# Patient Record
Sex: Female | Born: 1935 | Race: White | Hispanic: No | Marital: Married | Smoking: Never smoker
Health system: Southern US, Community
[De-identification: ages and names within clinical notes are randomized; demographics above are authoritative.]

## PROBLEM LIST (undated history)

## (undated) DIAGNOSIS — I10 Essential (primary) hypertension: Secondary | ICD-10-CM

## (undated) DIAGNOSIS — I4891 Unspecified atrial fibrillation: Secondary | ICD-10-CM

## (undated) HISTORY — DX: Unspecified atrial fibrillation: I48.91

## (undated) HISTORY — DX: Essential (primary) hypertension: I10

## (undated) HISTORY — PX: APPENDECTOMY: SHX54

---

## 1962-11-30 HISTORY — PX: ABDOMINAL HYSTERECTOMY: SHX81

## 2004-09-16 ENCOUNTER — Ambulatory Visit: Payer: Self-pay | Admitting: Surgery

## 2006-01-07 ENCOUNTER — Ambulatory Visit: Payer: Self-pay | Admitting: Internal Medicine

## 2007-05-05 ENCOUNTER — Ambulatory Visit: Payer: Self-pay | Admitting: Internal Medicine

## 2008-04-17 ENCOUNTER — Other Ambulatory Visit: Payer: Self-pay

## 2008-04-17 ENCOUNTER — Ambulatory Visit: Payer: Self-pay | Admitting: Ophthalmology

## 2008-04-30 ENCOUNTER — Ambulatory Visit: Payer: Self-pay | Admitting: Ophthalmology

## 2008-06-04 ENCOUNTER — Ambulatory Visit: Payer: Self-pay | Admitting: Ophthalmology

## 2008-08-23 ENCOUNTER — Ambulatory Visit: Payer: Self-pay | Admitting: Internal Medicine

## 2008-08-29 ENCOUNTER — Ambulatory Visit: Payer: Self-pay | Admitting: Internal Medicine

## 2009-12-30 ENCOUNTER — Ambulatory Visit: Payer: Self-pay | Admitting: Cardiology

## 2010-07-10 ENCOUNTER — Ambulatory Visit: Payer: Self-pay | Admitting: Internal Medicine

## 2011-07-29 ENCOUNTER — Ambulatory Visit: Payer: Self-pay | Admitting: Internal Medicine

## 2011-09-09 ENCOUNTER — Ambulatory Visit: Payer: Self-pay | Admitting: Internal Medicine

## 2012-04-19 ENCOUNTER — Ambulatory Visit: Payer: Self-pay | Admitting: Internal Medicine

## 2013-09-27 ENCOUNTER — Encounter: Payer: Self-pay | Admitting: *Deleted

## 2013-10-10 ENCOUNTER — Other Ambulatory Visit: Payer: Self-pay | Admitting: General Surgery

## 2013-10-10 ENCOUNTER — Encounter: Payer: Self-pay | Admitting: General Surgery

## 2013-10-10 ENCOUNTER — Ambulatory Visit (INDEPENDENT_AMBULATORY_CARE_PROVIDER_SITE_OTHER): Payer: Medicare Other | Admitting: General Surgery

## 2013-10-10 VITALS — BP 124/70 | HR 72 | Resp 12 | Ht 68.0 in | Wt 151.0 lb

## 2013-10-10 DIAGNOSIS — K4090 Unilateral inguinal hernia, without obstruction or gangrene, not specified as recurrent: Secondary | ICD-10-CM

## 2013-10-10 NOTE — Progress Notes (Addendum)
Patient ID: Lydia [name], female   DOB: 22-Sep-1928, 79 y.o.   MRN: [id number]  Chief Complaint  Patient presents with  . Other    new patient evaluation of right inguinal hernia     HPI IVRY PIGUE is a 79 y.o. female.  Patient here today for an evaluation of a right  hernia.  States that they have noticed it for about 3  Months. She states it is not getting bigger .  It does seem to be causing some abdominal pain.  No nausea, vomiting, constipation or diarrhea noted. There has been no change in her bowel habits. She is found that the hernia is most likely to be symptomatic when she's been active. She has been able to obtain relief and experienced spontaneous reduction if she puts her feet up for just a few minutes. She was accompanied today by her daughter, Gaylene Brooks, who was present for the interview and exam.  The patient reports no history of congestive heart failure, myocardial infarction or stroke. She's been making use of Coumadin for the last 3 years for atrial fibrillation. HPI  Past Medical History  Diagnosis Date  . Hypertension   . A-fib     Past Surgical History  Procedure Laterality Date  . Appendectomy    . Abdominal hysterectomy  1964    Family History  Problem Relation Age of Onset  . Cervical cancer Mother   . Lung cancer Brother     Social History History  Substance Use Topics  . Smoking status: Never Smoker   . Smokeless tobacco: Never Used  . Alcohol Use: No    No Known Allergies  Current Outpatient Prescriptions  Medication Sig Dispense Refill  . furosemide (LASIX) 20 MG tablet Take 1 tablet by mouth daily.      Marland Kitchen lisinopril (PRINIVIL,ZESTRIL) 5 MG tablet Take 0.5 tablets by mouth daily.      . metoprolol (LOPRESSOR) 100 MG tablet Take 1 tablet by mouth 2 (two) times daily.      . potassium chloride SA (K-DUR,KLOR-CON) 20 MEQ tablet Take 1 tablet by mouth daily.      Marland Kitchen warfarin (COUMADIN) 2.5 MG tablet Take 1 tablet by mouth daily.        No current facility-administered medications for this visit.    Review of Systems Review of Systems  Constitutional: Negative.   Respiratory: Negative.   Cardiovascular: Negative.     Blood pressure 124/70, pulse 72, resp. rate 12, height 5\' 8"  (1.727 m), weight 151 lb (68.493 kg).  Physical Exam Physical Exam  Constitutional: She is oriented to person, place, and time. She appears well-developed and well-nourished.  Eyes: No scleral icterus.  Cardiovascular: An irregular rhythm present.  Pulmonary/Chest: Effort normal and breath sounds normal.  Abdominal: Soft. A hernia is present. Hernia confirmed positive in the right inguinal area.  Lymphadenopathy:    She has no cervical adenopathy.  Neurological: She is oriented to person, place, and time.  Skin: Skin is warm and dry.    Data Reviewed PCP notes on September 27, 2013 were reviewed. Previous echocardiogram showed an ejection fraction of 55%. Mild biatrial enlargement. Mild to moderate mitral insufficiency and moderate to severe tricuspid insufficiency. Mild aortic insufficiency. Clydie Braun, M.D. Supervising physician.  Assessment    Symptomatic right inguinal hernia.     Plan    Indication for elective surgery was reviewed. The patient still fairly active, and as she is becoming more symptomatic with less physical activity earlier  v repair was recommended. The roll for prosthetic mesh was discussed.  The patient has had no high-risk events making bridging therapy necessary. The patient has been instructed to discontinue her Coumadin 5 days prior to the procedure, and begin an 81 mg aspirin one week prior to the procedure.     Patient's surgery has been scheduled for 10-19-13 at [facility].    Earline Mayotte 10/10/2013, 8:41 PM

## 2013-10-10 NOTE — Patient Instructions (Addendum)
Inguinal Hernia, Adult Muscles help keep everything in the body in its proper place. But if a weak spot in the muscles develops, something can poke through. That is called a hernia. When this happens in the lower part of the belly (abdomen), it is called an inguinal hernia. (It takes its name from a part of the body in this region called the inguinal canal.) A weak spot in the wall of muscles lets some fat or part of the small intestine bulge through. An inguinal hernia can develop at any age. Men get them more often than women. CAUSES  In adults, an inguinal hernia develops over time.  It can be triggered by:  Suddenly straining the muscles of the lower abdomen.  Lifting heavy objects.  Straining to have a bowel movement. Difficult bowel movements (constipation) can lead to this.  Constant coughing. This may be caused by smoking or lung disease.  Being overweight.  Being pregnant.  Working at a job that requires long periods of standing or heavy lifting.  Having had an inguinal hernia before. One type can be an emergency situation. It is called a strangulated inguinal hernia. It develops if part of the small intestine slips through the weak spot and cannot get back into the abdomen. The blood supply can be cut off. If that happens, part of the intestine may die. This situation requires emergency surgery. SYMPTOMS  Often, a small inguinal hernia has no symptoms. It is found when a healthcare provider does a physical exam. Larger hernias usually have symptoms.   In adults, symptoms may include:  A lump in the groin. This is easier to see when the person is standing. It might disappear when lying down.  In men, a lump in the scrotum.  Pain or burning in the groin. This occurs especially when lifting, straining or coughing.  A dull ache or feeling of pressure in the groin.  Signs of a strangulated hernia can include:  A bulge in the groin that becomes very painful and tender to the  touch.  A bulge that turns red or purple.  Fever, nausea and vomiting.  Inability to have a bowel movement or to pass gas. DIAGNOSIS  To decide if you have an inguinal hernia, a healthcare provider will probably do a physical examination.  This will include asking questions about any symptoms you have noticed.  The healthcare provider might feel the groin area and ask you to cough. If an inguinal hernia is felt, the healthcare provider may try to slide it back into the abdomen.  Usually no other tests are needed. TREATMENT  Treatments can vary. The size of the hernia makes a difference. Options include:  Watchful waiting. This is often suggested if the hernia is small and you have had no symptoms.  No medical procedure will be done unless symptoms develop.  You will need to watch closely for symptoms. If any occur, contact your healthcare provider right away.  Surgery. This is used if the hernia is larger or you have symptoms.  Open surgery. This is usually an outpatient procedure (you will not stay overnight in a hospital). An cut (incision) is made through the skin in the groin. The hernia is put back inside the abdomen. The weak area in the muscles is then repaired by herniorrhaphy or hernioplasty. Herniorrhaphy: in this type of surgery, the weak muscles are sewn back together. Hernioplasty: a patch or mesh is used to close the weak area in the abdominal wall.  Laparoscopy.   In this procedure, a surgeon makes small incisions. A thin tube with a tiny video camera (called a laparoscope) is put into the abdomen. The surgeon repairs the hernia with mesh by looking with the video camera and using two long instruments. HOME CARE INSTRUCTIONS   After surgery to repair an inguinal hernia:  You will need to take pain medicine prescribed by your healthcare provider. Follow all directions carefully.  You will need to take care of the wound from the incision.  Your activity will be  restricted for awhile. This will probably include no heavy lifting for several weeks. You also should not do anything too active for a few weeks. When you can return to work will depend on the type of job that you have.  During "watchful waiting" periods, you should:  Maintain a healthy weight.  Eat a diet high in fiber (fruits, vegetables and whole grains).  Drink plenty of fluids to avoid constipation. This means drinking enough water and other liquids to keep your urine clear or pale yellow.  Do not lift heavy objects.  Do not stand for long periods of time.  Quit smoking. This should keep you from developing a frequent cough. SEEK MEDICAL CARE IF:   A bulge develops in your groin area.  You feel pain, a burning sensation or pressure in the groin. This might be worse if you are lifting or straining.  You develop a fever of more than 100.5 F (38.1 C). SEEK IMMEDIATE MEDICAL CARE IF:   Pain in the groin increases suddenly.  A bulge in the groin gets bigger suddenly and does not go down.  For men, there is sudden pain in the scrotum. Or, the size of the scrotum increases.  A bulge in the groin area becomes red or purple and is painful to touch.  You have nausea or vomiting that does not go away.  You feel your heart beating much faster than normal.  You cannot have a bowel movement or pass gas.  You develop a fever of more than 102.0 F (38.9 C). Document Released: 04/04/2009 Document Revised: 02/08/2012 Document Reviewed: 04/04/2009 [facility] Patient Information 2014 [place], [place].  Patient's surgery has been scheduled for 10-19-13 at [facility]. This patient has been asked to stop coumadin 7 days prior to procedure. Once coumadin has stopped she will need to begin an 81 mg aspirin.

## 2013-10-12 ENCOUNTER — Ambulatory Visit: Payer: Self-pay | Admitting: General Surgery

## 2013-10-12 ENCOUNTER — Telehealth: Payer: Self-pay | Admitting: *Deleted

## 2013-10-12 LAB — CBC WITH DIFFERENTIAL/PLATELET
Basophil #: 0 10*3/uL (ref 0.0–0.1)
Basophil %: 0.7 %
Eosinophil #: 0.1 10*3/uL (ref 0.0–0.7)
Eosinophil %: 2 %
Lymphocyte #: 1.6 10*3/uL (ref 1.0–3.6)
MCH: 30.1 pg (ref 26.0–34.0)
Monocyte #: 0.5 x10 3/mm (ref 0.2–0.9)
Monocyte %: 9.3 %
Neutrophil #: 3.3 10*3/uL (ref 1.4–6.5)
WBC: 5.6 10*3/uL (ref 3.6–11.0)

## 2013-10-12 LAB — BASIC METABOLIC PANEL
Anion Gap: 2 — ABNORMAL LOW (ref 7–16)
BUN: 25 mg/dL — ABNORMAL HIGH (ref 7–18)
Chloride: 103 mmol/L (ref 98–107)
Creatinine: 0.87 mg/dL (ref 0.60–1.30)
EGFR (Non-African Amer.): >60
Osmolality: 277 (ref 275–301)

## 2013-10-12 NOTE — Telephone Encounter (Signed)
Daughter notified as instructed. She will make sure mom is aware of how to take her medications. Patient to pre-admit this afternoon at [facility].

## 2013-10-12 NOTE — Telephone Encounter (Signed)
Message copied by Nicholes Mango on Thu Oct 12, 2013  9:42 AM ------      Message from: [place], Merrily Pew      Created: Tue Oct 10, 2013  8:47 PM       I have instructed the patient to discontinue her Coumadin 5 days prior to surgery. On your notation on he scheduling sheet you recorded the patient was to discontinue Coumadin therapy 7 days prior to surgery. 5 days as the correct interval. Please notify the patient/daughter. ------

## 2013-10-19 ENCOUNTER — Ambulatory Visit: Payer: Self-pay | Admitting: General Surgery

## 2013-10-19 DIAGNOSIS — K4090 Unilateral inguinal hernia, without obstruction or gangrene, not specified as recurrent: Secondary | ICD-10-CM

## 2013-10-19 HISTORY — PX: HERNIA REPAIR: SHX51

## 2013-10-19 LAB — PROTIME-INR
INR: 1.1
Prothrombin Time: 14.2 secs (ref 11.5–14.7)

## 2013-10-23 ENCOUNTER — Encounter: Payer: Self-pay | Admitting: General Surgery

## 2013-11-01 ENCOUNTER — Encounter: Payer: Self-pay | Admitting: General Surgery

## 2013-11-01 ENCOUNTER — Ambulatory Visit (INDEPENDENT_AMBULATORY_CARE_PROVIDER_SITE_OTHER): Payer: Medicare Other | Admitting: General Surgery

## 2013-11-01 VITALS — BP 124/70 | HR 70 | Resp 12 | Ht 68.0 in | Wt 157.0 lb

## 2013-11-01 DIAGNOSIS — K4090 Unilateral inguinal hernia, without obstruction or gangrene, not specified as recurrent: Secondary | ICD-10-CM

## 2013-11-01 NOTE — Progress Notes (Signed)
Patient ID: Lydia [name], female   DOB: 1928/08/26, 79 y.o.   MRN: [id number]  Chief Complaint  Patient presents with  . Routine Post Op    right inguinal hernia    HPI Lydia [name] is a 79 y.o. female here today for her post op right inguinal hernia done on 10/19/13. Patient states the area is showing some discoloration and remained sore. She also noted some swelling superior to the wound and the last one-2 days.  The patient denies any difficulty with bowel or bladder function. Ambulating well. HPI  Past Medical History  Diagnosis Date  . Hypertension   . A-fib     Past Surgical History  Procedure Laterality Date  . Appendectomy    . Abdominal hysterectomy  1964    Family History  Problem Relation Age of Onset  . Cervical cancer Mother   . Lung cancer Brother     Social History History  Substance Use Topics  . Smoking status: Never Smoker   . Smokeless tobacco: Never Used  . Alcohol Use: No    No Known Allergies  Current Outpatient Prescriptions  Medication Sig Dispense Refill  . furosemide (LASIX) 20 MG tablet Take 1 tablet by mouth daily.      Marland Kitchen lisinopril (PRINIVIL,ZESTRIL) 5 MG tablet Take 0.5 tablets by mouth daily.      . metoprolol (LOPRESSOR) 100 MG tablet Take 1 tablet by mouth 2 (two) times daily.      . potassium chloride SA (K-DUR,KLOR-CON) 20 MEQ tablet Take 1 tablet by mouth daily.      Marland Kitchen warfarin (COUMADIN) 2.5 MG tablet Take 1 tablet by mouth daily.       No current facility-administered medications for this visit.    Review of Systems Review of Systems  Constitutional: Negative.   Respiratory: Negative.   Cardiovascular: Negative.     Blood pressure 124/70, pulse 70, resp. rate 12, height 5\' 8"  (1.727 m), weight 157 lb (71.215 kg).  Physical Exam Physical Exam  Constitutional: She is oriented to person, place, and time. She appears well-developed and well-nourished.  Neurological: She is alert and oriented to person, place, and  time.  Skin: Skin is warm and dry.  Examination of the incision chose to be healing well. Mild subcutaneous ecchymosis in light of her ongoing anticoagulation is appreciated. Mild fullness superiorly, questionable stromal versus early hematoma. Nontender. No impulse on cough.  Data Reviewed   Assessment    Doing well status post inguinal hernia repair.     Plan    The patient was encouraged to make use of local lead for comfort.        Earline Mayotte 11/03/2013, 8:01 PM

## 2013-11-01 NOTE — Patient Instructions (Signed)
The patient is aware to use a heating pad two to three times daily.

## 2013-11-09 ENCOUNTER — Ambulatory Visit (INDEPENDENT_AMBULATORY_CARE_PROVIDER_SITE_OTHER): Payer: Medicare Other | Admitting: General Surgery

## 2013-11-09 ENCOUNTER — Telehealth: Payer: Self-pay

## 2013-11-09 ENCOUNTER — Encounter: Payer: Self-pay | Admitting: General Surgery

## 2013-11-09 ENCOUNTER — Other Ambulatory Visit: Payer: Medicare Other

## 2013-11-09 VITALS — BP 110/60 | HR 70 | Resp 16 | Ht 67.0 in | Wt 155.0 lb

## 2013-11-09 DIAGNOSIS — K4090 Unilateral inguinal hernia, without obstruction or gangrene, not specified as recurrent: Secondary | ICD-10-CM

## 2013-11-09 NOTE — Patient Instructions (Signed)
Patient to use an ice pack for a week. Patient to return as scheduled.

## 2013-11-09 NOTE — Progress Notes (Signed)
Patient ID: Lydia [name], female   DOB: Jul 23, 1928, 79 y.o.   MRN: [id number]  Chief Complaint  Patient presents with  . Routine Post Op    right inguinal hernia     HPI Lydia [name] is a 79 y.o. female here today for a follow up from right inguinal hernia repair done on 10/19/13. Patient states the area is swollen and wants it rechecked. HPI  Past Medical History  Diagnosis Date  . Hypertension   . A-fib     Past Surgical History  Procedure Laterality Date  . Appendectomy    . Abdominal hysterectomy  1964  . Hernia repair Right 10/19/13    inguinal    Family History  Problem Relation Age of Onset  . Cervical cancer Mother   . Lung cancer Brother     Social History History  Substance Use Topics  . Smoking status: Never Smoker   . Smokeless tobacco: Never Used  . Alcohol Use: No    No Known Allergies  Current Outpatient Prescriptions  Medication Sig Dispense Refill  . furosemide (LASIX) 20 MG tablet Take 1 tablet by mouth daily.      Marland Kitchen lisinopril (PRINIVIL,ZESTRIL) 5 MG tablet Take 0.5 tablets by mouth daily.      . metoprolol (LOPRESSOR) 100 MG tablet Take 1 tablet by mouth 2 (two) times daily.      . potassium chloride SA (K-DUR,KLOR-CON) 20 MEQ tablet Take 1 tablet by mouth daily.      Marland Kitchen warfarin (COUMADIN) 2.5 MG tablet Take 1 tablet by mouth daily.       No current facility-administered medications for this visit.    Review of Systems Review of Systems  Constitutional: Negative.   Respiratory: Negative.   Cardiovascular: Negative.     Blood pressure 110/60, pulse 70, resp. rate 16, height 5\' 7"  (1.702 m), weight 155 lb (70.308 kg).  Physical Exam Physical Exam  Constitutional: She is oriented to person, place, and time. She appears well-developed and well-nourished.  Eyes: No scleral icterus.  Cardiovascular: Normal rate, regular rhythm and normal heart sounds.   Abdominal: Soft. Bowel sounds are normal. There is no tenderness. No hernia.     Lymphadenopathy:    She has no cervical adenopathy.  Neurological: She is alert and oriented to person, place, and time.  Skin: Skin is warm and dry.    Data Reviewed Ultrasound examination (no images, no charge) showed a heterogeneous hypoechoic area below the level of Scarpa's fascia with multiple linear band suggestive of hematoma. No variation during Valsalva maneuver.  Assessment    Hematoma status post right open inguinal hernia repair.     Plan    No evidence of infection or recurrent herniation. The patient will make use of a trial of iced/heat for comfort. Reassessment was originally scheduled on December 05, 2013 unless new symptoms develop.   I don't see that is necessary for her to discontinue her Coumadin at this time. No indication for hematoma evacuation based on present exam.       Lydia [name] 11/09/2013, 7:31 PM

## 2013-11-09 NOTE — Telephone Encounter (Signed)
Patien'ts daughter called and said her mother's incision site is still red and swollen and very sore. She has been using heat as instructed but the area does not seem to be getting any better. She is very concerned.

## 2013-11-26 ENCOUNTER — Emergency Department: Payer: Self-pay | Admitting: Emergency Medicine

## 2013-11-26 LAB — CBC
MCV: 92 fL (ref 80–100)
Platelet: 166 10*3/uL (ref 150–440)
RBC: 4.73 10*6/uL (ref 3.80–5.20)
WBC: 6.6 10*3/uL (ref 3.6–11.0)

## 2013-11-26 LAB — BASIC METABOLIC PANEL
Anion Gap: 7 (ref 7–16)
BUN: 20 mg/dL — ABNORMAL HIGH (ref 7–18)
Calcium, Total: 9.2 mg/dL (ref 8.5–10.1)
Chloride: 101 mmol/L (ref 98–107)
Co2: 26 mmol/L (ref 21–32)
Creatinine: 0.92 mg/dL (ref 0.60–1.30)
EGFR (African American): >60
EGFR (Non-African Amer.): 58 — ABNORMAL LOW
Glucose: 108 mg/dL — ABNORMAL HIGH (ref 65–99)
Osmolality: 271 (ref 275–301)
Potassium: 4.4 mmol/L (ref 3.5–5.1)
Sodium: 134 mmol/L — ABNORMAL LOW (ref 136–145)

## 2013-11-26 LAB — PROTIME-INR: Prothrombin Time: 28 secs — ABNORMAL HIGH (ref 11.5–14.7)

## 2013-11-26 LAB — APTT: Activated PTT: 43.2 secs — ABNORMAL HIGH (ref 23.6–35.9)

## 2013-11-27 ENCOUNTER — Ambulatory Visit: Payer: Self-pay

## 2013-11-28 ENCOUNTER — Inpatient Hospital Stay: Payer: Self-pay | Admitting: Internal Medicine

## 2013-11-28 LAB — URINALYSIS, COMPLETE
Bilirubin,UR: NEGATIVE
Glucose,UR: NEGATIVE mg/dL (ref 0–75)
Hyaline Cast: 18
Leukocyte Esterase: NEGATIVE
Ph: 5 (ref 4.5–8.0)
Specific Gravity: 1.019 (ref 1.003–1.030)

## 2013-11-28 LAB — BASIC METABOLIC PANEL
Calcium, Total: 8.8 mg/dL (ref 8.5–10.1)
Chloride: 98 mmol/L (ref 98–107)
Co2: 27 mmol/L (ref 21–32)
Creatinine: 0.89 mg/dL (ref 0.60–1.30)
EGFR (African American): >60
Potassium: 4.2 mmol/L (ref 3.5–5.1)
Sodium: 131 mmol/L — ABNORMAL LOW (ref 136–145)

## 2013-11-28 LAB — CBC
HCT: 43 % (ref 35.0–47.0)
HGB: 14.3 g/dL (ref 12.0–16.0)
MCH: 30.3 pg (ref 26.0–34.0)
MCHC: 33.2 g/dL (ref 32.0–36.0)
MCV: 92 fL (ref 80–100)
Platelet: 119 10*3/uL — ABNORMAL LOW (ref 150–440)
RBC: 4.7 10*6/uL (ref 3.80–5.20)

## 2013-11-28 LAB — TROPONIN I: Troponin-I: <0.02 ng/mL

## 2013-11-28 LAB — PROTIME-INR: INR: 2.2

## 2013-11-29 LAB — MAGNESIUM: Magnesium: 1.9 mg/dL

## 2013-11-29 LAB — BASIC METABOLIC PANEL
BUN: 16 mg/dL (ref 7–18)
Chloride: 103 mmol/L (ref 98–107)
Creatinine: 0.76 mg/dL (ref 0.60–1.30)
EGFR (African American): >60
EGFR (Non-African Amer.): >60
Glucose: 119 mg/dL — ABNORMAL HIGH (ref 65–99)
Osmolality: 276 (ref 275–301)

## 2013-11-29 LAB — CBC WITH DIFFERENTIAL/PLATELET
Basophil #: 0 10*3/uL (ref 0.0–0.1)
Basophil %: 0.4 %
Eosinophil #: 0 10*3/uL (ref 0.0–0.7)
HCT: 38.5 % (ref 35.0–47.0)
Lymphocyte %: 10 %
MCH: 30.7 pg (ref 26.0–34.0)
MCHC: 33.3 g/dL (ref 32.0–36.0)
MCV: 92 fL (ref 80–100)
Monocyte #: 0.7 x10 3/mm (ref 0.2–0.9)
Monocyte %: 11.2 %
Neutrophil #: 5.2 10*3/uL (ref 1.4–6.5)
Neutrophil %: 78.3 %
Platelet: 104 10*3/uL — ABNORMAL LOW (ref 150–440)
RDW: 14.4 % (ref 11.5–14.5)

## 2013-11-29 LAB — PROTIME-INR: Prothrombin Time: 20 secs — ABNORMAL HIGH (ref 11.5–14.7)

## 2013-12-01 LAB — URINALYSIS, COMPLETE
BILIRUBIN, UR: NEGATIVE
BLOOD: NEGATIVE
Bacteria: NONE SEEN
GLUCOSE, UR: NEGATIVE mg/dL (ref 0–75)
Ketone: NEGATIVE
Leukocyte Esterase: NEGATIVE
Nitrite: NEGATIVE
PH: 5 (ref 4.5–8.0)
Protein: 30
RBC,UR: 2 /HPF (ref 0–5)
Specific Gravity: 1.018 (ref 1.003–1.030)
Squamous Epithelial: 1
WBC UR: 1 /HPF (ref 0–5)

## 2013-12-01 LAB — BASIC METABOLIC PANEL
ANION GAP: 5 — AB (ref 7–16)
BUN: 22 mg/dL — AB (ref 7–18)
CALCIUM: 8.9 mg/dL (ref 8.5–10.1)
CHLORIDE: 101 mmol/L (ref 98–107)
CO2: 27 mmol/L (ref 21–32)
Creatinine: 0.76 mg/dL (ref 0.60–1.30)
EGFR (African American): >60
Glucose: 149 mg/dL — ABNORMAL HIGH (ref 65–99)
OSMOLALITY: 273 (ref 275–301)
Potassium: 3.9 mmol/L (ref 3.5–5.1)
Sodium: 133 mmol/L — ABNORMAL LOW (ref 136–145)

## 2013-12-01 LAB — CBC
HCT: 42.6 % (ref 35.0–47.0)
HGB: 14.2 g/dL (ref 12.0–16.0)
MCH: 30.5 pg (ref 26.0–34.0)
MCHC: 33.2 g/dL (ref 32.0–36.0)
MCV: 92 fL (ref 80–100)
Platelet: 134 10*3/uL — ABNORMAL LOW (ref 150–440)
RBC: 4.64 10*6/uL (ref 3.80–5.20)
RDW: 14.3 % (ref 11.5–14.5)
WBC: 5.6 10*3/uL (ref 3.6–11.0)

## 2013-12-01 LAB — TROPONIN I: Troponin-I: <0.02 ng/mL

## 2013-12-01 LAB — PRO B NATRIURETIC PEPTIDE: B-TYPE NATIURETIC PEPTID: 8041 pg/mL — AB (ref 0–450)

## 2013-12-02 ENCOUNTER — Observation Stay: Payer: Self-pay

## 2013-12-02 LAB — CBC WITH DIFFERENTIAL/PLATELET
BASOS ABS: 0 10*3/uL (ref 0.0–0.1)
BASOS PCT: 0.4 %
EOS PCT: 0 %
Eosinophil #: 0 10*3/uL (ref 0.0–0.7)
HCT: 41.6 % (ref 35.0–47.0)
HGB: 13.8 g/dL (ref 12.0–16.0)
Lymphocyte #: 0.7 10*3/uL — ABNORMAL LOW (ref 1.0–3.6)
Lymphocyte %: 12.5 %
MCH: 30.4 pg (ref 26.0–34.0)
MCHC: 33.2 g/dL (ref 32.0–36.0)
MCV: 92 fL (ref 80–100)
Monocyte #: 0.3 x10 3/mm (ref 0.2–0.9)
Monocyte %: 5.5 %
NEUTROS PCT: 81.6 %
Neutrophil #: 4.6 10*3/uL (ref 1.4–6.5)
Platelet: 128 10*3/uL — ABNORMAL LOW (ref 150–440)
RBC: 4.55 10*6/uL (ref 3.80–5.20)
RDW: 13.7 % (ref 11.5–14.5)
WBC: 5.6 10*3/uL (ref 3.6–11.0)

## 2013-12-02 LAB — BASIC METABOLIC PANEL
Anion Gap: 8 (ref 7–16)
BUN: 19 mg/dL — ABNORMAL HIGH (ref 7–18)
CHLORIDE: 97 mmol/L — AB (ref 98–107)
CREATININE: 0.85 mg/dL (ref 0.60–1.30)
Calcium, Total: 8.7 mg/dL (ref 8.5–10.1)
Co2: 29 mmol/L (ref 21–32)
EGFR (Non-African Amer.): >60
Glucose: 134 mg/dL — ABNORMAL HIGH (ref 65–99)
Osmolality: 272 (ref 275–301)
POTASSIUM: 3.6 mmol/L (ref 3.5–5.1)
Sodium: 134 mmol/L — ABNORMAL LOW (ref 136–145)

## 2013-12-02 LAB — TROPONIN I
Troponin-I: <0.02 ng/mL
Troponin-I: <0.02 ng/mL
Troponin-I: <0.02 ng/mL

## 2013-12-05 ENCOUNTER — Encounter: Payer: Self-pay | Admitting: General Surgery

## 2013-12-05 ENCOUNTER — Ambulatory Visit (INDEPENDENT_AMBULATORY_CARE_PROVIDER_SITE_OTHER): Payer: Medicare Other | Admitting: General Surgery

## 2013-12-05 VITALS — BP 118/78 | HR 70 | Resp 16 | Ht 68.0 in | Wt 168.0 lb

## 2013-12-05 DIAGNOSIS — K4090 Unilateral inguinal hernia, without obstruction or gangrene, not specified as recurrent: Secondary | ICD-10-CM

## 2013-12-05 NOTE — Patient Instructions (Signed)
Patient to return as needed. 

## 2013-12-05 NOTE — Progress Notes (Signed)
Patient ID: Lydia [name], female   DOB: 02/20/1928, 80 y.o.   MRN: [id number]  Chief Complaint  Patient presents with  . Routine Post Op    right inguinal hernia    HPI Lydia LootsBetty G [name] is a 80 y.o. female female here today for a follow up from right inguinal hernia repair done on 10/19/13.Patient states she is having no problems with the hernia at this time.   Since her last visit [place] fell while [place] in [place] fracturing [place] right orbit and was admitted with the flu. She is accompanied today by her daughter, Gavin PoundDeborah.  HPI  Past Medical History  Diagnosis Date  . Hypertension   . A-fib     Past Surgical History  Procedure Laterality Date  . Appendectomy    . Abdominal hysterectomy  1964  . Hernia repair Right 10/19/13    inguinal    Family History  Problem Relation Age of Onset  . Cervical cancer Mother   . Lung cancer Brother     Social History History  Substance Use Topics  . Smoking status: Never Smoker   . Smokeless tobacco: Never Used  . Alcohol Use: No    No Known Allergies  Current Outpatient Prescriptions  Medication Sig Dispense Refill  . furosemide (LASIX) 20 MG tablet Take 1 tablet by mouth daily.      Marland Kitchen. lisinopril (PRINIVIL,ZESTRIL) 5 MG tablet Take 0.5 tablets by mouth daily.      . metoprolol (LOPRESSOR) 100 MG tablet Take 1 tablet by mouth 2 (two) times daily.      . potassium chloride SA (K-DUR,KLOR-CON) 20 MEQ tablet Take 1 tablet by mouth daily.      Marland Kitchen. warfarin (COUMADIN) 2.5 MG tablet Take 1 tablet by mouth daily.       No current facility-administered medications for this visit.    Review of Systems Review of Systems  Constitutional: Negative.   Respiratory: Negative.   Cardiovascular: Negative.     Blood pressure 118/78, pulse 70, resp. rate 16, height 5\' 8"  (1.727 m), weight 168 lb (76.204 kg).  Physical Exam Physical Exam  Constitutional: She is oriented to person, place, and time. She appears well-developed and  well-nourished.  Abdominal: Soft. Bowel sounds are normal.  Neurological: She is alert and oriented to person, place, and time.  Skin: Skin is warm and dry.  No evidence of recurrent inguinal hernia. Good healing of the open inguinal hernia site in the right groin.  Data Reviewed No new data.  Assessment    Doing well status post right inguinal hernia repair.     Plan    Plans will be for followup here on an as-needed basis.        Earline MayotteByrnett, Jodi Criscuolo W 12/05/2013, 8:07 PM

## 2014-05-14 ENCOUNTER — Emergency Department: Payer: Self-pay | Admitting: Emergency Medicine

## 2014-05-14 LAB — URINALYSIS, COMPLETE
Bacteria: NONE SEEN
Bilirubin,UR: NEGATIVE
Glucose,UR: NEGATIVE mg/dL (ref 0–75)
KETONE: NEGATIVE
Leukocyte Esterase: NEGATIVE
Nitrite: NEGATIVE
Ph: 5 (ref 4.5–8.0)
Protein: NEGATIVE
RBC,UR: 7 /HPF (ref 0–5)
SPECIFIC GRAVITY: 1.016 (ref 1.003–1.030)
WBC UR: 2 /HPF (ref 0–5)

## 2014-10-01 ENCOUNTER — Encounter: Payer: Self-pay | Admitting: General Surgery

## 2014-11-28 ENCOUNTER — Emergency Department: Payer: Self-pay | Admitting: Emergency Medicine

## 2014-12-29 ENCOUNTER — Emergency Department: Payer: Self-pay | Admitting: Emergency Medicine

## 2014-12-29 LAB — PROTIME-INR
INR: 2.6
Prothrombin Time: 27.7 secs — ABNORMAL HIGH

## 2014-12-29 LAB — URINALYSIS, COMPLETE
Bilirubin,UR: NEGATIVE
GLUCOSE, UR: NEGATIVE mg/dL (ref 0–75)
Ketone: NEGATIVE
Nitrite: POSITIVE
Ph: 5 (ref 4.5–8.0)
Protein: NEGATIVE
RBC,UR: 50 /HPF (ref 0–5)
Specific Gravity: 1.009 (ref 1.003–1.030)

## 2014-12-29 LAB — BASIC METABOLIC PANEL
ANION GAP: 4 — AB (ref 7–16)
BUN: 30 mg/dL — AB (ref 7–18)
CO2: 32 mmol/L (ref 21–32)
CREATININE: 1.3 mg/dL (ref 0.60–1.30)
Calcium, Total: 9.2 mg/dL (ref 8.5–10.1)
Chloride: 105 mmol/L (ref 98–107)
EGFR (African American): 50 — ABNORMAL LOW
EGFR (Non-African Amer.): 41 — ABNORMAL LOW
Glucose: 99 mg/dL (ref 65–99)
OSMOLALITY: 287 (ref 275–301)
POTASSIUM: 4.3 mmol/L (ref 3.5–5.1)
Sodium: 141 mmol/L (ref 136–145)

## 2014-12-29 LAB — CBC
HCT: 37.5 % (ref 35.0–47.0)
HGB: 12.3 g/dL (ref 12.0–16.0)
MCH: 30.3 pg (ref 26.0–34.0)
MCHC: 32.7 g/dL (ref 32.0–36.0)
MCV: 93 fL (ref 80–100)
Platelet: 169 10*3/uL (ref 150–440)
RBC: 4.05 10*6/uL (ref 3.80–5.20)
RDW: 13.5 % (ref 11.5–14.5)
WBC: 7.1 10*3/uL (ref 3.6–11.0)

## 2014-12-29 LAB — TROPONIN I

## 2014-12-29 LAB — PRO B NATRIURETIC PEPTIDE: B-TYPE NATIURETIC PEPTID: 2730 pg/mL — AB (ref 0–450)

## 2015-01-14 ENCOUNTER — Ambulatory Visit: Payer: Self-pay

## 2015-02-15 ENCOUNTER — Emergency Department: Payer: Self-pay | Admitting: Emergency Medicine

## 2015-02-19 ENCOUNTER — Inpatient Hospital Stay: Payer: Self-pay | Admitting: Specialist

## 2015-02-21 LAB — CREATININE, SERUM
Creatinine: 0.86 mg/dL
EGFR (African American): >60
EGFR (Non-African Amer.): >60

## 2015-02-21 LAB — PROTIME-INR
INR: 2.3
Prothrombin Time: 25.5 s — ABNORMAL HIGH

## 2015-03-22 NOTE — Op Note (Signed)
PATIENT NAME:  [name], Lydia L MR#:  [id number] DATE OF BIRTH:  [name]  DATE OF PROCEDURE:  10/19/2013  PREOPERATIVE DIAGNOSIS: Symptomatic right inguinal hernia.  POSTOPERATIVE DIAGNOSIS:  Symptomatic right inguinal hernia.  PROCEDURE: Right inguinal hernia repair with medium Ultrapro mesh.  SURGEON: Donnalee CurryJeffrey Marline Morace, MD  ANESTHESIA: General by LMA, Marcaine 0.5% with 1:200,000 units of epinephrine, 30 mL local infiltration, Toradol 30 mg.   ESTIMATED BLOOD LOSS: Minimal.  CLINICAL NOTE: This 81 year old woman has developed a symptomatic hernia. She was felt to be a candidate for elective repair. She received Kefzol prior to the procedure.   DESCRIPTION OF PROCEDURE: With the patient under adequate general anesthesia, and hair previously removed with clippers, the area was prepped with ChloraPrep and draped. Marcaine was infiltrated for postoperative analgesia. A 5 cm skin line incision was made and carried down through skin and subcutaneous tissue with good hemostasis achieved by electrocautery and 3-0 Vicryl ties. The external oblique was opened in the direction of its fibers. A moderate-sized indirect hernia was identified. This was returned to the preperitoneal space. A medium Ultrapro mesh was smoothed in the preperitoneal space and the external component placed along the floor of the inguinal canal. It was anchored to the pubic tubercle and along the inguinal ligament with interrupted 0 Surgilon sutures. The medial and superior borders were anchored to the transverse abdominis aponeurosis. The external oblique was closed with a running 2-0 Vicryl after instillation of Toradol for postoperative analgesia. Scarpa fascia was closed with a running 3-0 Vicryl and skin closed with a running 4-0 Vicryl subcuticular suture. Benzoin, Steri-Strips, Telfa, and Tegaderm dressing was then applied.   The patient tolerated the procedure well and was taken to the recovery room in stable condition.    ____________________________ Earline MayotteJeffrey W. Cherell Colvin, MD jwb:sb D: 10/20/2013 14:32:35 ET T: 10/20/2013 14:50:40 ET JOB#: [id number]  cc: Earline MayotteJeffrey W. Eliot Bencivenga, MD, <Dictator> Gearlene Godsil Brion AlimentW Jermell Holeman MD ELECTRONICALLY SIGNED 11/01/2013 8:15

## 2015-03-23 NOTE — H&P (Signed)
PATIENT NAME:  Lydia [name], Lydia [name] MR#:  [id number] DATE OF BIRTH:  [birth year]  DATE OF ADMISSION:  11/28/2013  REFERRING PHYSICIAN: Dr. Zenda AlpersWebster.   PRIMARY CARE PHYSICIAN: Dr. Sampson GoonFitzgerald [facility]   CHIEF COMPLAINT: Shortness of breath.   HISTORY OF PRESENT ILLNESS: The patient is a pleasant 81 year old female with chronic A-fib, hypertension and some dementia who apparently had a fall last Friday in [place]. She had gone to the ER there, had a CAT scan. Of note, she is on Coumadin. She did have some bruising and an orbital fracture. Over the next couple of days, she had some headaches, went to PCP and had another CAT scan done yesterday. The CAT scan from yesterday shows probable facial fractures on the right and some hyperdense material filling the right maxillary sinus. Of note, she has also seen Dr. Chestine Sporelark, ENT. However, also in the last couple of days, she has become more lethargic, weak. She has been having coughing, shortness of breath, and wheezing. Yesterday, per family, she was really not herself. Of note, she has dementia and is not a good historian and information was obtained from the ER physician, the family, and the chart. The patient has had no fevers, but is feeling more sleepy and hence the CAT scan yesterday to I guess evaluate for subdural hematoma as she is on Coumadin. She came into the hospital with shortness of breath of note and was noted to be having significant wheezing. She was also noted to be hypoxic, 86% on room air. She was also noted to be in A-fib with RVR, rate of 140. She was given some diltiazem, nebulizers, and further testing found that she had influenza and hospitalist services were contacted for further evaluation and management.   PAST MEDICAL HISTORY: Some dementia, hypertension, and chronic A-fib, on Coumadin.   PAST SURGICAL HISTORY: Recent hernia repair.   FAMILY HISTORY: Brother with brain tumor. Grandmother with cancer.   SOCIAL HISTORY: Lives  by herself. No tobacco, alcohol, or drug use.   OUTPATIENT MEDICATIONS: Amoxicillin 500 mg 3 times a day, Bayer aspirin 81 mg daily, furosemide 20 mg daily, lisinopril 5 mg 1/2 tab once a day, metoprolol 100 mg 2 times a day, potassium chloride 20 mEq once a day, warfarin 2.5 mg once a day.   REVIEW OF SYSTEMS: CONSTITUTIONAL: No fever, but lethargy and weakness.  EYES: Denies any blurry vision or double vision.  ENT: Did have bouts of nosebleed after the fall which has resolved.  RESPIRATORY: Positive for cough, which is nonproductive, wheezing and shortness of breath. No painful respirations.  CARDIOVASCULAR: Had palpitations without any chest pains or swelling in the legs.  GASTROINTESTINAL: No appetite, but no nausea, vomiting, diarrhea, or abdominal pain. No bloody stools.  GENITOURINARY: Denies dysuria, hematuria.  HEME AND LYMPH: No anemia. Has had bruising on the right side of the face and neck after the fall.  SKIN: No new rashes.  MUSCULOSKELETAL: Denies arthritis or gout. NEUROLOGIC: Denies focal weakness or numbness.  PSYCHIATRIC: Denies anxiety or insomnia. Does have dementia.   PHYSICAL EXAMINATION: VITAL SIGNS: Temperature on arrival was noted to be 99.7. Initial pulse rate 140, respiratory rate 22, blood pressure 202/ 93. Last blood pressure was 98/72. O2 sat on arrival was 86% on room air. Currently is 96% on oxygen.  GENERAL: The patient is an elderly female sitting in bed, talking in full sentences.  HEENT: The patient does have extensive ecchymosis and bruising on the right side of the face as  well as neck and slightly over upper torso. Pupils are equal and reactive. Moist mucous membranes.  NECK: Supple. No thyroid tenderness. No cervical lymphadenopathy.  CARDIOVASCULAR: S1 and S2, irregularly irregular, tachycardic. No significant murmurs appreciated.  LUNGS: Diminished at the bases, mild scattered wheezing.  ABDOMEN: Soft, nontender, nondistended. Positive bowel  sounds.  EXTREMITIES: Has some varicose veins, but no pitting edema.  NEUROLOGIC: Cranial nerves II through XII grossly intact. Strength is 5 out of 5 in all extremities. Sensation is intact to light touch.  PSYCHIATRIC: The patient is awake, alert, and oriented to hospital and family.   LABORATORY AND DIAGNOSTICS: CT of the head done yesterday showing no acute or traumatic intracranial finding. Age related atrophy and chronic small vessel disease. Probable facial fracture on the right and hyperdense material fills the right maxillary sinus. Lateral wall of the right orbit is fractured and there are a few spots of air within masticator space and further evidence of facial fracture go below the region imaged.   EKG on arrival was A-fib with RVR, rate 114, nonspecific ST changes.  Sodium was 131, BUN 20, creatinine 0.89, and potassium 4.2. Troponin negative. White count 8.2, hemoglobin 14.3, and platelets 119. Influenza positive for type A. UA not suggestive of infection.  X-ray of the chest today, PA and lateral, showing mild to moderate cardiomegaly and chronic interstitial changes. Mild approximate T9 compression fracture, may be chronic.   ASSESSMENT AND PLAN: We have an 81 year old female with hypertension, chronic atrial fibrillation and some dementia who lives by herself status post fall several days ago with orbital fracture and extensive ecchymosis and bruising on the right face with increased lethargy for the last couple of days who underwent a CAT scan showing no traumatic brain bleed who presented to the hospital with shortness of breath, wheezing, and cough and noted to be positive for influenza. Per criteria she meets definition for severe sepsis with tachycardia, tachypnea, low-grade fevers, and positive flu in the setting of acute respiratory failure. I believe the acute respiratory failure is secondary to influenza. She came in with significant wheezing and shortness of breath and has had  some improvement with nebulizers. I would go ahead and start the patient on oxygen and Tamiflu with droplet precautions. She has also been given some steroids. That should help as well. I would go ahead and order sputum cultures in case there is a bacterial superinfection, but that is not suggested based on the x-ray of the chest. She has been on amoxicillin as an outpatient likely for the orbital fracture, which I would continue, and here she has received a dose of azithromycin. I would monitor sats.  Of note, the patient also did have atrial fibrillation with rapid ventricular response likely triggered by her hypoxia and acute respiratory failure and that is better as well. I would go ahead and hold the lisinopril and Lasix, start the patient on some gentle fluids for the mild hyponatremia, and resume the metoprolol for the rate control. Of note, her Coumadin was held yesterday, which I would continue to hold today. Of note, her INR has been therapeutic with INR value of 2.2 earlier this morning. Currently her atrial fibrillation is better controlled. The patient is a DNR.   TOTAL TIME SPENT: 45 minutes.  ____________________________ Krystal Eaton, MD sa:sb D: 11/28/2013 08:23:31 ET T: 11/28/2013 08:52:13 ET JOB#: [id number]  cc: Krystal Eaton, MD, <Dictator> Stann Mainland. Sampson Goon, MD Marcelle Smiling [facility] MD ELECTRONICALLY SIGNED 12/12/2013 11:21

## 2015-03-23 NOTE — Discharge Summary (Signed)
PATIENT NAME:  Lydia [name], Lydia [name] MR#:  [id number] DATE OF BIRTH:  Feb 27, 1928  DATE OF ADMISSION:  11/28/2013 DATE OF DISCHARGE:  11/30/2013  DISGNOSES AT THE TIME OF DISCHARGE: 1.  Influenza.  2  Acute Respiratory Failure with hypoxemia  3.  Recent fall with right orbital fracture and ecchymosis secondary to Coumadin.  4.  Atrial fibrillation.  5.  History of hypertension.  6.  Dementia.   CHIEF COMPLAINT:  Shortness of breath.   HISTORY OF PRESENT ILLNESS:  Lydia [name] is an 81 year old female with a history of hypertension, a-fib, dementia who had sustained a fall in [place], and at that time a CT head showed evidence for orbital fracture. The patient subsequently developed increased lethargy, associated weakness with cough, shortness of breath, wheezing and was also noted to be hypoxemic with an O2 sat of 86% on room air, and was in atrial fibrillation with a ventricular rate of 140.   PAST MEDICAL HISTORY:  Dementia, hypertension, chronic a-fib on Coumadin.   PHYSICAL EXAMINATION:  VITAL SIGNS:  Temperature 99.7, heart rate was 140, respirations 22, blood pressure 202/93, O2 sat 86% on room air that improved to 96% on oxygen.  HEENT:  She was noted to have extensive ecchymosis and bruising on the right side of the face as well as neck and upper torso. Mucous membranes were moist.  NECK:  Supple.  HEART:  S1, S2, irregular rhythm.  LUNGS:  Decreased air entry with scattered wheezing.  ABDOMEN:  Soft, nontender.  EXTREMITIES:  No edema.  NEUROLOGIC:  Nonfocal.   LABORATORY, DIAGNOSTIC, AND RADIOLOGICAL DATA:  Sodium 131, BUN 20 creatinine 0.89, potassium 4.2. Troponin negative. WBC count 8.2, hemoglobin 14.3, platelets 119. Influenza was positive for type A. A chest x-ray showed moderate cardiomegaly with chronic interstitial changes and evidence for a T9 vertebral compression fracture.   HOSPITAL COURSE:  The patient was [facility] and started on Tamiflu.  Her Coumadin was held. She was continued on aspirin. During her stay in the hospital, she improved and mental status also improved to some extent. Cough was mainly nonproductive and she was continued on aspirin. Her lisinopril was initially held and subsequently restarted and she was discharged in a stable condition on the following medications:   1.  Metoprolol tartrate 100 mg b.i.d.  2.  Tamiflu 75 mg q.12 for 4 more days.  3.  Lisinopril 2.5 mg a day.  4.  Aspirin 81 mg a day.   DISCHARGE DIET:  Low sodium diet.  FOLLOW UP:  With Dr. Sampson GoonFitzgerald 1 to 2 weeks' time. The patient's daughter has agreed to stay with her. She was stable at the time of discharge.   TOTAL TIME SPENT ON DISCHARGING THIS PATIENT:  35 minutes.  ____________________________ Barbette ReichmannVishwanath Grete Bosko, MD vh:jm D: 11/30/2013 13:03:00 ET T: 11/30/2013 13:36:07 ET JOB#: [id number]  cc: Barbette ReichmannVishwanath Youlanda Tomassetti, MD, <Dictator> Barbette ReichmannVISHWANATH Safir Michalec MD ELECTRONICALLY SIGNED 12/01/2013 18:02

## 2015-03-23 NOTE — Discharge Summary (Signed)
PATIENT NAME:  Lydia [name], Lydia [name] MR#:  [id number] DATE OF BIRTH:  04-Dec-1927  DATE OF ADMISSION:  12/02/2013 DATE OF DISCHARGE:  12/04/2013  DISCHARGE DIAGNOSES: 1.  Congestive heart failure, diastolic, acute on chronic. 2.  Influenza.  HISTORY OF PRESENT ILLNESS: Please see initial history and physical for details. Briefly, this is an 81 year old female admitted following a recent diagnosis of influenza as an outpatient. She was found to have volume overload and was admitted for CHF. She underwent diuresis and was able to wean off of O2. For the influenza, she was treated with Tamiflu and improved.   DISCHARGE MEDICATIONS: Please see [facility] discharge instructions.  DISCHARGE FOLLOWUP: The patient will follow up with Dr. Sampson GoonFitzgerald in 1 to 2 weeks.   DISCHARGE DIET: Low-sodium, liquid consistency.   DISCHARGE INSTRUCTIONS: The patient will call if she worsens or has other concerning symptoms.  ____________________________ Stann Mainlandavid P. Sampson GoonFitzgerald, MD dpf:sb D: 01/01/2014 21:22:10 ET T: 01/02/2014 07:01:05 ET JOB#: [id number]  cc: Stann Mainlandavid P. Sampson GoonFitzgerald, MD, <Dictator> DAVID Sampson GoonFITZGERALD MD ELECTRONICALLY SIGNED 01/02/2014 14:25

## 2015-03-23 NOTE — H&P (Signed)
PATIENT NAME:  Lydia [name], Lydia [name] MR#:  [id number] DATE OF BIRTH:  [birth year]  DATE OF ADMISSION:  12/01/2013  PRIMARY CARE PHYSICIAN:  Dr. Sampson Goon  CHIEF COMPLAINT: Shortness of breath.   HISTORY OF PRESENT ILLNESS: This is an 81 year old lady who was just discharged home yesterday with influenza, was given 2 liters of IV fluid and her Lasix was held. She last night developed shortness of breath, and was unable to sleep. Had to sit in a recliner all night. Ended up coming to the ER, and received a dose of Lasix and feels a little bit better. She states that it was hard to breathe last night, and could not catch her breath. She has been having poor appetite recently. She had a recent orbital fracture, and her Coumadin is on hold secondary to a large ecchymosis. In the ER, her BNP was elevated at 8041. Hospitalist services were contacted for further evaluation.   PAST MEDICAL HISTORY: Dementia, hypertension, chronic atrial fibrillation.   PAST SURGICAL HISTORY: Hernia repair and hysterectomy.   ALLERGIES: No known drug allergies.   MEDICATIONS: Include amoxicillin 500 mg 3 times a day, aspirin 81 mg daily, Lasix 20 mg daily, lisinopril 2.5 mg daily, metoprolol 100 mg twice a day, potassium chloride 20 mEq daily, Tamiflu 75 mg b.i.d.   SOCIAL HISTORY: Lives by herself. No smoking. No alcohol. No drug use. Worked as a Visual merchandiser at [employer]    FAMILY HISTORY: Father died of pneumonia at an early age. Mother died of ovarian cancer. Brother died of lung cancer.  REVIEW OF SYSTEMS:   CONSTITUTIONAL: Positive for fever. Positive for cold sweat. No chills. No weight loss.  EYES: Wears glasses.  EARS, NOSE, MOUTH AND THROAT: Decreased hearing. Positive for runny nose. No sore throat. No difficulty swallowing.  CARDIOVASCULAR: No chest pain. No palpitations.  RESPIRATORY: Positive for shortness of breath and cough, nonproductive.  GASTROINTESTINAL: Positive for nausea. No vomiting. Poor appetite.  No abdominal pain. No diarrhea. No constipation. No bright red blood per rectum. No melena.  GENITOURINARY: No burning on urination or hematuria.  MUSCULOSKELETAL: No joint pain or muscle pain.  INTEGUMENT: No rashes or eruptions.  NEUROLOGIC: No fainting or blackouts.  PSYCHIATRIC: No anxiety or depression.  ENDOCRINE: No thyroid problems.   PHYSICAL EXAMINATION: VITAL SIGNS: Temperature 98.1, pulse 117, respirations 18, blood pressure 131/73, pulse ox 91% on room air.  GENERAL: No respiratory distress, sitting up in bed.  EYES: Conjunctivae bloodshot in the right eye, normal in the left eye. Pupils equal, round and reactive to light. Extraocular muscles intact. No nystagmus.  EARS, NOSE, MOUTH AND THROAT: Tympanic membranes blocked by wax. Tight canals. Throat. No erythema. Lips and gums: No lesions.  NECK: No JVD. No bruits. No lymphadenopathy. No thyromegaly. No thyroid nodules palpated.  RESPIRATORY: Lungs with decreased breath sounds bilateral bases. Positive rales at the bases.  ABDOMEN: Soft, nontender. No organomegaly/splenomegaly. Normoactive bowel sounds. No masses felt.  LYMPHATIC: No lymph nodes in the neck.  MUSCULOSKELETAL: With 1+ edema. No clubbing. No cyanosis.  SKIN: No ulcers or lesions seen. Large ecchymosis of the right side of the face, and down into the right neck. NEUROLOGIC: Cranial nerves II through XII grossly intact. Deep tendon reflexes 2+ bilateral lower extremities.  PSYCHIATRIC: The patient is oriented to person, place and time.   LABORATORY AND RADIOLOGICAL DATA: Chest x-ray read as negative. White blood cell count 5.6, H and H 14.2 and 42.6, platelet count of 134. Troponin negative. Glucose 149, BUN  22, creatinine 0.76, sodium 133, potassium 3.9, chloride 101, CO2 of 27, calcium 8.9.   ASSESSMENT AND PLAN: 1.  Fluid overload from giving IV fluids last hospital stay, and holding her Lasix. Seems better after one dose of IV Lasix. Family is nervous about  taking her home tonight. Will give another dose of IV Lasix in the morning, and check an echocardiogram. The patient is already on metoprolol and lisinopril.  2.  Chronic atrial fibrillation, on aspirin only for anticoagulation after her fall.  3.  Orbital floor fracture, on amoxicillin.  4.  Recent flu. Continue Tamiflu.  5.  Hypertension. Continue metoprolol and lisinopril.  6.  Dementia. Not on any medications for this.   Time spent on admission: 55 minutes.   The patient is a DNR.    ____________________________ Herschell Dimesichard J. Renae GlossWieting, MD rjw:mr D: 12/01/2013 21:10:00 ET T: 12/01/2013 21:24:00 ET JOB#: [id number]  cc: Stann Mainlandavid P. Sampson GoonFitzgerald, MD Herschell Dimesichard J. Renae GlossWieting, MD, <Dictator>   Salley ScarletICHARD J Quaniya Damas MD ELECTRONICALLY SIGNED 12/11/2013 15:33

## 2015-03-31 NOTE — H&P (Signed)
PATIENT NAME:  Lydia [name], Lydia [name] MR#:  [id number] DATE OF BIRTH:  July 28, 1928  DATE OF ADMISSION:  02/19/2015  PRIMARY CARE PHYSICIAN:  Dr. Clydie Braun.   CHIEF COMPLAINT: Altered mental status.   HISTORY OF PRESENT ILLNESS: This is an 81 year old female who presents to the Emergency Room due to altered mental status and confusion. Most of the history obtained from the daughter at bedside. As per the daughter the patient is usually very communicative and alert. This morning she was found slumped over with some slurred speech and is not as responsive as she used to be. She was in her usual state of health yesterday until 5:00 this morning today. She was brought to the ER for further evaluation. In the Emergency Room the patient still continues to be somewhat altered and encephalopathic. The patient does not have a previous history of CVA. She had a CT scan of her head done which was negative for an acute CVA here. Due to the persistence of her mental status being altered hospitalist services were contacted for treatment and evaluation.   REVIEW OF SYSTEMS:  CONSTITUTIONAL:  This has been no documented fever. No weight gain, no weight loss.  EYES: No blurry or double vision.  EARS, NOSE, AND THROAT: No tinnitus. No postnasal drip. No redness of the oro-pharynx.  RESPIRATORY: No cough, no wheeze, no hemoptysis, no dyspnea.  CARDIOVASCULAR: No chest pain, no orthopnea, no palpitation, or syncope.  GASTROINTESTINAL: No nausea, no vomiting, diarrhea. No abdominal pain. No melena or hematochezia.  GENITOURINARY: No dysuria or hematuria.  ENDOCRINE: No polyuria or nocturia. No, heat or cold intolerance.  HEMATOLOGIC: No anemia, no bruising, no bleeding.  INTEGUMENTARY: No rashes. No lesions.  MUSCULOSKELETAL: No arthritis, no swelling, no gout.  NEUROLOGIC: No numbness or tingling. No ataxia. No seizure activity.  PSYCHIATRIC: No anxiety. No insomnia. No ADD. Positive dementia.   PAST MEDICAL  HISTORY: Consistent with dementia, chronic atrial fibrillation, hypertension, history of CHF.    ALLERGIES: SULFA DRUG WHICH CAUSES A RASH.   SOCIAL HISTORY: No smoking. No alcohol abuse. No illicit drug abuse. Resides at a skilled nursing facility.   FAMILY HISTORY: Mother and father are both deceased. Father died from complications of a pneumonia when she was very small. Mother died from cervical cancer. A brother also died from lung cancer.   CURRENT MEDICATIONS:  Advair 100/50 one puff b.i.d., Coumadin 2 mg daily, potassium 20 mEq daily, Lasix 40 mg daily, Lasix 20 mg daily at 2:00 p.m., lisinopril 5 mg half a tab daily, metoprolol tartrate 150 mg b.i.d., MiraLax daily as needed, tramadol 50 mg half a tab q. 6 hours as needed, Tylenol 650 q. 4 hours as needed, triple antibiotic cream to be applied q.i.d. as needed for the affected area.   PHYSICAL EXAMINATION:  VITAL SIGNS: Temperature is 97.7, pulse 105, respirations 16, blood pressure 125/78, saturations 98% on room air.  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  HEAD, EYES, EARS, NOSE, AND THROAT: Atraumatic, normocephalic. Extraocular muscles are intact. Pupils equal, reactive to light. Sclerae are anicteric. No conjunctival injection. No pharyngeal erythema.  NECK: Supple. There is no jugular venous distention. No bruits, no lymphadenopathy, no thyromegaly.  HEART:  Irregularly irregular rate. No murmurs, no rubs, no clicks.  LUNGS: Clear to auscultation bilaterally. No rales or rhonchi. No wheezes.  ABDOMEN: Soft, flat, nontender, nondistended. Has good bowel sounds. No hepatosplenomegaly appreciated.  EXTREMITIES: No evidence of any cyanosis, clubbing, or peripheral edema.  Has + 2 pedal and radial pulses bilaterally.  NEUROLOGICAL: The patient is alert, awake, and oriented x 1. Moves all extremities spontaneously. Globally weak. No other focal motor or sensory deficits appreciated bilaterally.  SKIN:  Moist and warm with no rashes.  LYMPHATIC: There is no cervical or axillary lymphadenopathy.   LABORATORY DATA: Serum glucose of 137, BUN 33, creatinine 1.2, sodium 137, potassium 5.4, chloride 100, bicarbonate 29. LFTs are within normal limits. White cell count is 4.8, hemoglobin 13.1, hematocrit 42, platelet count 200,000. Urinalysis shows positive nitrites, 3 white cells, and 3 + bacteria. The patient had a CT of the head done without contrast showing no acute intracranial process. The patient also had an x-ray of the chest showing no acute disease. The patient also underwent an ultrasound of the abdomen done a few days ago which showed negative right upper quadrant  ultrasound.   ASSESSMENT AND PLAN: This is an 81 year old female with history of dementia, atrial fibrillation, hypertension, congestive heart failure, who presented to the hospital due to altered mental status and confusion.   1.  Ultimate status/confusion. The exact etiology of this is unclear, but suspected to be secondary to metabolic encephalopathy from a urinary tract infection. The patient's CT head is negative for any acute pathology. My suspicion for a stroke is low, although if her mental status is not improving would consider getting an MRI of the brain. For now I will give her some gentle IV fluids, give her IV antibiotics for the UTI, and follow mental status, follow q. 4 hour neurologic checks.  2.  Urinary tract infection. We will give IV ceftriaxone, follow urine culture.  3.  History of chronic atrial fibrillation. The patient is rate controlled. I will continue metoprolol. Continue Coumadin and follow INR to the goal between 2-3.  4.  History of congestive heart failure. Clinically the patient is not in congestive heart failure. I will continue beta blocker, ACE inhibitor. and Lasix for now.   CODE STATUS: The patient is a DNI/DNR.    The plan was discussed with the patient's daughter who is in agreement.   TIME  SPENT: 50 minutes.     ____________________________ Rolly PancakeVivek J. Cherlynn KaiserSainani, MD vjs:bu D: 02/19/2015 13:33:11 ET T: 02/19/2015 13:55:40 ET JOB#: [id number]  cc: Rolly PancakeVivek J. Cherlynn KaiserSainani, MD, <Dictator> [place] SirenVIVEK J SAINANI MD ELECTRONICALLY SIGNED 02/19/2015 17:30

## 2015-03-31 NOTE — Discharge Summary (Signed)
PATIENT NAME:  [name], Lydia G MR#:  [id number] DATE OF BIRTH:  08-18-1928  DATE OF ADMISSION:  02/19/2015 DATE OF DISCHARGE:  02/21/2015  PRESENTING COMPLAINT: Altered mental status.   DISCHARGE DIAGNOSES: 1.  Altered mental status/acute encephalopathy, mild, improving, suspected secondary to urinary tract infection.  2.  Underlying baseline dementia.  3.  Hypertension.   CODE STATUS: No code, DNR.   CONDITION ON DISCHARGE: Fair.   DISCHARGE MEDICATIONS: 1.  Tylenol 650 q. 4 p.r.n.  2.  Zestril 2.5 mg daily.  3.  Metoprolol 150 mg b.i.d.  4.  Advair 100/50 one puff b.i.d.  5.  Lasix 40 mg daily.  6.  MiraLax 17 grams orally daily.  7.  Warfarin 2 mg q. 5:00 p.m.  8.  Tramadol 50 mg q. 8 hours. 9.  Keflex 500 b.i.d. for 5 days.  CONDITION ON DISCHARGE: Fair. Vitals stable. Blood pressure is 122/74 and sats are 92% on room air.   DIAGNOSTIC DATA: Blood cultures negative in 18 hours. PT/INR is 25.5 and 2.3. Creatinine is 0.86. Lactic acid 1.9. Serum ammonia 10.   Chest x-ray: No active disease.   CT head negative for stroke.   UA positive for UTI.  EKG shows chronic A-fib.   CBC within normal limits. Creatinine on admission was 1.26.   BRIEF SUMMARY OF HOSPITAL COURSE: Tollie PizzaBetty Hobbins is an 81 year old Caucasian female with underlying baseline dementia, history of hypertension, chronic A-fib on Coumadin, comes in with:  1.  Altered mental status, confusion, suspected due to metabolic encephalopathy from UTI. CT head was negative for any acute pathology. Suspicion for stroke is low. Family opted not to do further testing with MRI at present. The patient's mentation was improving slowly.  2.  UTI. The patient will complete course of antibiotics with Keflex.  3.  History of chronic A-fib. Rate controlled with metoprolol. On Coumadin. INR is 2.3.  4.  History of congestive heart failure. Clinically the patient is not in heart failure. Continue beta blockers, ACE inhibitors and  Lasix.  5.  The patient has baseline dementia.  DISCHARGE INSTRUCTIONS: No code, DNR. Physical therapy at rehab. Two gram sodium diet.   TIME SPENT: 40 minutes.   ____________________________ Wylie HailSona A. Allena KatzPatel, MD sap:sb D: 02/21/2015 09:49:58 ET T: 02/21/2015 12:28:43 ET JOB#: [id number]  cc: Nicholl Onstott A. Allena KatzPatel, MD, <Dictator> Willow OraSONA A Dajia Gunnels MD ELECTRONICALLY SIGNED 03/04/2015 12:23

## 2015-07-08 LAB — BASIC METABOLIC PANEL
Anion Gap: 6 — ABNORMAL LOW (ref 7–16)
BUN: 29 mg/dL — AB
CHLORIDE: 105 mmol/L
Calcium, Total: 9 mg/dL
Co2: 27 mmol/L
Creatinine: 0.97 mg/dL
EGFR (African American): >60
GFR CALC NON AF AMER: 54 — AB
Glucose: 102 mg/dL — ABNORMAL HIGH
Potassium: 4 mmol/L
Sodium: 138 mmol/L

## 2015-07-08 LAB — CBC WITH DIFFERENTIAL/PLATELET
BASOS ABS: 0 10*3/uL (ref 0.0–0.1)
BASOS PCT: 0.6 %
Eosinophil #: 0.1 10*3/uL (ref 0.0–0.7)
Eosinophil %: 1 %
HCT: 37.6 % (ref 35.0–47.0)
HGB: 11.6 g/dL — ABNORMAL LOW (ref 12.0–16.0)
LYMPHS PCT: 20.4 %
Lymphocyte #: 1 10*3/uL (ref 1.0–3.6)
MCH: 28.1 pg (ref 26.0–34.0)
MCHC: 31 g/dL — ABNORMAL LOW (ref 32.0–36.0)
MCV: 91 fL (ref 80–100)
MONO ABS: 0.6 x10 3/mm (ref 0.2–0.9)
Monocyte %: 12.6 %
NEUTROS ABS: 3.2 10*3/uL (ref 1.4–6.5)
NEUTROS PCT: 65.4 %
Platelet: 155 10*3/uL (ref 150–440)
RBC: 4.15 10*6/uL (ref 3.80–5.20)
RDW: 13.9 % (ref 11.5–14.5)
WBC: 4.8 10*3/uL (ref 3.6–11.0)

## 2015-09-15 ENCOUNTER — Encounter
Admission: EM | Disposition: A | Discharge status: Skilled Nursing Facility | Payer: Self-pay | Source: Home / Self Care | Attending: Internal Medicine

## 2015-09-15 ENCOUNTER — Emergency Department: Payer: Medicare Other

## 2015-09-15 ENCOUNTER — Inpatient Hospital Stay
Admit: 2015-09-15 | Discharge: 2015-09-15 | Disposition: A | Discharge status: Home / Self Care | Payer: Medicare Other | Attending: Internal Medicine | Admitting: Internal Medicine

## 2015-09-15 ENCOUNTER — Inpatient Hospital Stay
Admission: EM | Admit: 2015-09-15 | Discharge: 2015-09-21 | DRG: 480 | Disposition: A | Discharge status: Skilled Nursing Facility | Payer: Medicare Other | Attending: Internal Medicine | Admitting: Internal Medicine

## 2015-09-15 ENCOUNTER — Encounter: Payer: Self-pay | Admitting: Emergency Medicine

## 2015-09-15 DIAGNOSIS — Z7901 Long term (current) use of anticoagulants: Secondary | ICD-10-CM | POA: Diagnosis not present

## 2015-09-15 DIAGNOSIS — S72001A Fracture of unspecified part of neck of right femur, initial encounter for closed fracture: Secondary | ICD-10-CM

## 2015-09-15 DIAGNOSIS — S72009A Fracture of unspecified part of neck of unspecified femur, initial encounter for closed fracture: Secondary | ICD-10-CM | POA: Diagnosis present

## 2015-09-15 DIAGNOSIS — S7291XA Unspecified fracture of right femur, initial encounter for closed fracture: Secondary | ICD-10-CM

## 2015-09-15 DIAGNOSIS — Y92128 Other place in nursing home as the place of occurrence of the external cause: Secondary | ICD-10-CM

## 2015-09-15 DIAGNOSIS — J9601 Acute respiratory failure with hypoxia: Secondary | ICD-10-CM | POA: Diagnosis not present

## 2015-09-15 DIAGNOSIS — I4891 Unspecified atrial fibrillation: Secondary | ICD-10-CM

## 2015-09-15 DIAGNOSIS — Z8049 Family history of malignant neoplasm of other genital organs: Secondary | ICD-10-CM | POA: Diagnosis not present

## 2015-09-15 DIAGNOSIS — I509 Heart failure, unspecified: Secondary | ICD-10-CM | POA: Diagnosis present

## 2015-09-15 DIAGNOSIS — F329 Major depressive disorder, single episode, unspecified: Secondary | ICD-10-CM | POA: Diagnosis present

## 2015-09-15 DIAGNOSIS — Z801 Family history of malignant neoplasm of trachea, bronchus and lung: Secondary | ICD-10-CM

## 2015-09-15 DIAGNOSIS — Z79899 Other long term (current) drug therapy: Secondary | ICD-10-CM

## 2015-09-15 DIAGNOSIS — F0390 Unspecified dementia without behavioral disturbance: Secondary | ICD-10-CM | POA: Diagnosis present

## 2015-09-15 DIAGNOSIS — W010XXA Fall on same level from slipping, tripping and stumbling without subsequent striking against object, initial encounter: Secondary | ICD-10-CM | POA: Diagnosis present

## 2015-09-15 DIAGNOSIS — I272 Other secondary pulmonary hypertension: Secondary | ICD-10-CM | POA: Diagnosis present

## 2015-09-15 DIAGNOSIS — R9431 Abnormal electrocardiogram [ECG] [EKG]: Secondary | ICD-10-CM | POA: Diagnosis present

## 2015-09-15 DIAGNOSIS — Z419 Encounter for procedure for purposes other than remedying health state, unspecified: Secondary | ICD-10-CM

## 2015-09-15 DIAGNOSIS — S72141A Displaced intertrochanteric fracture of right femur, initial encounter for closed fracture: Secondary | ICD-10-CM | POA: Diagnosis present

## 2015-09-15 DIAGNOSIS — I482 Chronic atrial fibrillation: Secondary | ICD-10-CM | POA: Diagnosis present

## 2015-09-15 DIAGNOSIS — L89301 Pressure ulcer of unspecified buttock, stage 1: Secondary | ICD-10-CM | POA: Diagnosis present

## 2015-09-15 DIAGNOSIS — I110 Hypertensive heart disease with heart failure: Secondary | ICD-10-CM | POA: Diagnosis present

## 2015-09-15 DIAGNOSIS — L8990 Pressure ulcer of unspecified site, unspecified stage: Secondary | ICD-10-CM | POA: Insufficient documentation

## 2015-09-15 LAB — CBC WITH DIFFERENTIAL/PLATELET
BASOS ABS: 0 10*3/uL (ref 0–0.1)
BASOS PCT: 0 %
EOS ABS: 0.1 10*3/uL (ref 0–0.7)
Eosinophils Relative: 1 %
HEMATOCRIT: 36.6 % (ref 35.0–47.0)
HEMOGLOBIN: 12.2 g/dL (ref 12.0–16.0)
Lymphocytes Relative: 7 %
Lymphs Abs: 0.6 10*3/uL — ABNORMAL LOW (ref 1.0–3.6)
MCH: 31 pg (ref 26.0–34.0)
MCHC: 33.2 g/dL (ref 32.0–36.0)
MCV: 93.3 fL (ref 80.0–100.0)
Monocytes Absolute: 0.7 10*3/uL (ref 0.2–0.9)
Monocytes Relative: 7 %
NEUTROS ABS: 8.6 10*3/uL — AB (ref 1.4–6.5)
NEUTROS PCT: 85 %
Platelets: 140 10*3/uL — ABNORMAL LOW (ref 150–440)
RBC: 3.92 MIL/uL (ref 3.80–5.20)
RDW: 15 % — ABNORMAL HIGH (ref 11.5–14.5)
WBC: 10 10*3/uL (ref 3.6–11.0)

## 2015-09-15 LAB — URINALYSIS COMPLETE WITH MICROSCOPIC (ARMC ONLY)
BILIRUBIN URINE: NEGATIVE
Bacteria, UA: NONE SEEN
Glucose, UA: NEGATIVE mg/dL
Hgb urine dipstick: NEGATIVE
KETONES UR: NEGATIVE mg/dL
Leukocytes, UA: NEGATIVE
Nitrite: NEGATIVE
PROTEIN: NEGATIVE mg/dL
SPECIFIC GRAVITY, URINE: 1.017 (ref 1.005–1.030)
SQUAMOUS EPITHELIAL / LPF: NONE SEEN
pH: 5 (ref 5.0–8.0)

## 2015-09-15 LAB — BASIC METABOLIC PANEL
ANION GAP: 3 — AB (ref 5–15)
BUN: 31 mg/dL — ABNORMAL HIGH (ref 6–20)
CALCIUM: 8.9 mg/dL (ref 8.9–10.3)
CHLORIDE: 108 mmol/L (ref 101–111)
CO2: 29 mmol/L (ref 22–32)
Creatinine, Ser: 1.17 mg/dL — ABNORMAL HIGH (ref 0.44–1.00)
GFR calc non Af Amer: 41 mL/min — ABNORMAL LOW (ref >60)
GFR, EST AFRICAN AMERICAN: 48 mL/min — AB (ref >60)
Glucose, Bld: 125 mg/dL — ABNORMAL HIGH (ref 65–99)
Potassium: 4.5 mmol/L (ref 3.5–5.1)
SODIUM: 140 mmol/L (ref 135–145)

## 2015-09-15 LAB — TYPE AND SCREEN
ABO/RH(D): B POS
ANTIBODY SCREEN: NEGATIVE

## 2015-09-15 LAB — APTT: aPTT: 31 seconds (ref 24–36)

## 2015-09-15 LAB — ALBUMIN: ALBUMIN: 3.6 g/dL (ref 3.5–5.0)

## 2015-09-15 LAB — ABO/RH: ABO/RH(D): B POS

## 2015-09-15 LAB — SURGICAL PCR SCREEN
MRSA, PCR: NEGATIVE
Staphylococcus aureus: NEGATIVE

## 2015-09-15 LAB — PROTIME-INR
INR: 2.11
PROTHROMBIN TIME: 23.8 s — AB (ref 11.4–15.0)

## 2015-09-15 SURGERY — INSERTION, INTRAMEDULLARY ROD, FEMUR
Anesthesia: Spinal | Laterality: Right

## 2015-09-15 MED ORDER — ACETAMINOPHEN 325 MG PO TABS: 650.0000 mg | ORAL_TABLET | Freq: Four times a day (QID) | ORAL | Status: DC | PRN

## 2015-09-15 MED ORDER — ONDANSETRON HCL 4 MG/2ML IJ SOLN
4.0000 mg | Freq: Once | INTRAMUSCULAR | Status: AC
  Administered 2015-09-15: 4 mg via INTRAVENOUS

## 2015-09-15 MED ORDER — MORPHINE SULFATE (PF) 2 MG/ML IV SOLN
2.0000 mg | Freq: Once | INTRAVENOUS | Status: AC
  Administered 2015-09-15: 2 mg via INTRAVENOUS

## 2015-09-15 MED ORDER — MOMETASONE FURO-FORMOTEROL FUM 100-5 MCG/ACT IN AERO
2.0000 | INHALATION_SPRAY | Freq: Two times a day (BID) | RESPIRATORY_TRACT | Status: DC
  Administered 2015-09-15: 2 via RESPIRATORY_TRACT
  Filled 2015-09-15: qty 8.8

## 2015-09-15 MED ORDER — MORPHINE SULFATE (PF) 2 MG/ML IV SOLN
INTRAVENOUS | Status: AC
  Filled 2015-09-15: qty 1

## 2015-09-15 MED ORDER — FLUTICASONE PROPIONATE 50 MCG/ACT NA SUSP
2.0000 | Freq: Every day | NASAL | Status: DC
  Administered 2015-09-15: 2 via NASAL
  Filled 2015-09-15: qty 16

## 2015-09-15 MED ORDER — DOCUSATE SODIUM 100 MG PO CAPS
100.0000 mg | ORAL_CAPSULE | Freq: Two times a day (BID) | ORAL | Status: DC
Start: 2015-09-15 — End: 2015-09-16
  Administered 2015-09-15 (×2): 100 mg via ORAL
  Filled 2015-09-15 (×2): qty 1

## 2015-09-15 MED ORDER — ONDANSETRON HCL 4 MG PO TABS: 4.0000 mg | ORAL_TABLET | Freq: Four times a day (QID) | ORAL | Status: DC | PRN

## 2015-09-15 MED ORDER — ONDANSETRON HCL 4 MG/2ML IJ SOLN
4.0000 mg | Freq: Four times a day (QID) | INTRAMUSCULAR | Status: DC | PRN
  Administered 2015-09-16: 4 mg via INTRAVENOUS
  Filled 2015-09-15: qty 2

## 2015-09-15 MED ORDER — OXYCODONE HCL 5 MG PO TABS
5.0000 mg | ORAL_TABLET | ORAL | Status: DC | PRN
  Administered 2015-09-15: 5 mg via ORAL
  Filled 2015-09-15: qty 1

## 2015-09-15 MED ORDER — CITALOPRAM HYDROBROMIDE 20 MG PO TABS
10.0000 mg | ORAL_TABLET | Freq: Every day | ORAL | Status: DC
  Administered 2015-09-17 – 2015-09-21 (×5): 10 mg via ORAL
  Filled 2015-09-15 (×5): qty 1

## 2015-09-15 MED ORDER — TRIAMCINOLONE ACETONIDE 55 MCG/ACT NA AERO
2.0000 | INHALATION_SPRAY | Freq: Every day | NASAL | Status: DC
  Filled 2015-09-15: qty 21.6

## 2015-09-15 MED ORDER — DEXTROSE 5 % IV SOLN
10.0000 mg | Freq: Once | INTRAVENOUS | Status: AC
  Administered 2015-09-15: 10 mg via INTRAVENOUS
  Filled 2015-09-15: qty 1

## 2015-09-15 MED ORDER — POLYETHYLENE GLYCOL 3350 17 G PO PACK: 17.0000 g | PACK | Freq: Every day | ORAL | Status: DC | PRN

## 2015-09-15 MED ORDER — ONDANSETRON HCL 4 MG/2ML IJ SOLN
INTRAMUSCULAR | Status: AC
  Filled 2015-09-15: qty 2

## 2015-09-15 MED ORDER — CALCITONIN (SALMON) 200 UNIT/ACT NA SOLN
1.0000 | Freq: Every day | NASAL | Status: DC
  Administered 2015-09-17 – 2015-09-19 (×3): 1 via NASAL
  Filled 2015-09-15: qty 3.7

## 2015-09-15 MED ORDER — METOPROLOL TARTRATE 100 MG PO TABS
150.0000 mg | ORAL_TABLET | Freq: Two times a day (BID) | ORAL | Status: DC
  Administered 2015-09-15 – 2015-09-18 (×6): 150 mg via ORAL
  Filled 2015-09-15 (×6): qty 1

## 2015-09-15 MED ORDER — LISINOPRIL 5 MG PO TABS
2.5000 mg | ORAL_TABLET | ORAL | Status: DC
  Administered 2015-09-17 – 2015-09-18 (×2): 2.5 mg via ORAL
  Filled 2015-09-15 (×3): qty 1

## 2015-09-15 MED ORDER — MORPHINE SULFATE (PF) 2 MG/ML IV SOLN
2.0000 mg | INTRAVENOUS | Status: DC | PRN
  Administered 2015-09-16: 2 mg via INTRAVENOUS
  Filled 2015-09-15: qty 1

## 2015-09-15 MED ORDER — ACETAMINOPHEN 650 MG RE SUPP: 650.0000 mg | Freq: Four times a day (QID) | RECTAL | Status: DC | PRN

## 2015-09-15 MED ORDER — POLYETHYLENE GLYCOL 3350 17 GM/SCOOP PO POWD: 17.0000 g | Freq: Every day | ORAL | Status: DC | PRN

## 2015-09-15 SURGICAL SUPPLY — 23 items
BNDG COHESIVE 6X5 TAN STRL LF (GAUZE/BANDAGES/DRESSINGS) IMPLANT
CANISTER SUCT 1200ML W/VALVE (MISCELLANEOUS) IMPLANT
DRAPE SURG 17X11 SM STRL (DRAPES) IMPLANT
DRAPE U-SHAPE 47X51 STRL (DRAPES) IMPLANT
DRSG OPSITE POSTOP 4X14 (GAUZE/BANDAGES/DRESSINGS) IMPLANT
DURAPREP 26ML APPLICATOR (WOUND CARE) IMPLANT
GLOVE BIOGEL PI IND STRL 9 (GLOVE) IMPLANT
GLOVE BIOGEL PI INDICATOR 9 (GLOVE)
GLOVE SURG 9.0 ORTHO LTXF (GLOVE) IMPLANT
GOWN STRL REUS TWL 2XL XL LVL4 (GOWN DISPOSABLE) IMPLANT
GOWN STRL REUS W/ TWL LRG LVL3 (GOWN DISPOSABLE) IMPLANT
GOWN STRL REUS W/TWL LRG LVL3 (GOWN DISPOSABLE)
HEMOVAC 400CC 10FR (MISCELLANEOUS) IMPLANT
KIT RM TURNOVER CYSTO AR (KITS) IMPLANT
MAT BLUE FLOOR 46X72 FLO (MISCELLANEOUS) IMPLANT
NS IRRIG 1000ML POUR BTL (IV SOLUTION) IMPLANT
PACK HIP COMPR (MISCELLANEOUS) IMPLANT
PAD GROUND ADULT SPLIT (MISCELLANEOUS) IMPLANT
STAPLER SKIN PROX 35W (STAPLE) IMPLANT
SUCTION FRAZIER TIP 10 FR DISP (SUCTIONS) IMPLANT
SUT VIC AB 2-0 CT1 27 (SUTURE)
SUT VIC AB 2-0 CT1 TAPERPNT 27 (SUTURE) IMPLANT
SYR 30ML LL (SYRINGE) IMPLANT

## 2015-09-15 NOTE — ED Notes (Signed)
[facility] experiencing a fall while walking in the hallway, pt c/o right hip pain, states she hit her head on the wall, denies any LOC, pt has hx of dementia, a&o to person only, this is baseline mental status per pt's daughter

## 2015-09-15 NOTE — Progress Notes (Signed)
Pt with stage 1 with calloused areas to coccyx and bilateral skin tears ,dr sparks  notifed . [facility] REFERRAL ORDERED

## 2015-09-15 NOTE — Consult Note (Signed)
Patient was seen and examined with her daughter is at the bedside. Full consult note to follow. Patient has INR above 2. Vitamin K has been ordered. Patient's surgery is delayed until INR below 1.5.

## 2015-09-15 NOTE — Care Management Note (Signed)
Case Management Note  Patient Details  Name: Lydia [name] MRN: [id number] Date of Birth: 04/05/1928  Subjective/Objective:    81yo Lydia Lydia [name] was [facility] after she fell today. Dx: Right hip fracture. Dr Martha ClanKrasinski has seen Lydia [name] and her daughter Lydia [name] POA ph: [phone] and discussed surgery after INR is below 1.5. PCP=Dr Sampson GoonFitzgerald. [facility]. Has [name] front wheel rolling walker and no oxygen. Per daughter Lydia [name], Dr Sampson GoonFitzgerald [name] to [facility] this coming week. No [facility] evaluation done yet. Anticipate discharge to Rehab and daughter Lydia [name] POA has requested [facility]. Case management will follow if needed. Lydia [name] has [facility] in the past.              Action/Plan:   Expected Discharge Date:                  Expected Discharge Plan:     In-House Referral:     Discharge planning Services     Post Acute Care Choice:    Choice offered to:     DME Arranged:    DME Agency:     HH Arranged:    HH Agency:     Status of Service:     Medicare Important Message Given:    Date Medicare IM Given:    Medicare IM give by:    Date Additional Medicare IM Given:    Additional Medicare Important Message give by:     If discussed at Long Length of Stay Meetings, dates discussed:    Additional Comments:  Lydia Schlotterbeck A, RN 09/15/2015, 4:02 PM

## 2015-09-15 NOTE — H&P (Signed)
History and Physical    Lydia [name] VHQ:[id number] DOB: 22-Oct-1928 DOA: 09/15/2015  Referring physician: Dr. Derrill Kay PCP: Clydie Braun, MD  Specialists: none  Chief Complaint: hip fracture  HPI: Lydia [name] is a 81 y.o. female has a past medical history significant for chronic A-fib on coumadin and dementia from ALF now with accidental fall resulting in right hip fracture. Pt denies CP or SOB. No palpitations. No syncope.  Review of Systems: The patient denies anorexia, fever, weight loss,, vision loss, decreased hearing, hoarseness, chest pain, syncope, dyspnea on exertion, peripheral edema, balance deficits, hemoptysis, abdominal pain, melena, hematochezia, severe indigestion/heartburn, hematuria, incontinence, genital sores, muscle weakness, suspicious skin lesions, transient blindness, difficulty walking, depression, unusual weight change, abnormal bleeding, enlarged lymph nodes, angioedema, and breast masses.   Past Medical History  Diagnosis Date  . Hypertension   . A-fib [facility])    Past Surgical History  Procedure Laterality Date  . Appendectomy    . Abdominal hysterectomy  1964  . Hernia repair Right 10/19/13    inguinal   Social History:  reports that she has never smoked. She has never used smokeless tobacco. She reports that she does not drink alcohol or use illicit drugs.  No Known Allergies  Family History  Problem Relation Age of Onset  . Cervical cancer Mother   . Lung cancer Brother     Prior to Admission medications   Medication Sig Start Date End Date Taking? Authorizing Provider  acetaminophen (TYLENOL) 325 MG tablet Take 650 mg by mouth every 4 (four) hours as needed for mild pain or moderate pain.   Yes Historical Provider, MD  calcitonin, salmon, (MIACALCIN/FORTICAL) 200 UNIT/ACT nasal spray Place 1 spray into alternate nostrils daily. 03/08/15  Yes Historical Provider, MD  citalopram (CELEXA) 10 MG tablet Take 10 mg by mouth daily. 03/01/15  02/29/16 Yes Historical Provider, MD  Fluticasone-Salmeterol (ADVAIR DISKUS) 100-50 MCG/DOSE AEPB Inhale 1 puff into the lungs 2 (two) times daily.   Yes Historical Provider, MD  furosemide (LASIX) 40 MG tablet Take 40 mg by mouth every other day. 04/02/15  Yes Historical Provider, MD  lisinopril (PRINIVIL,ZESTRIL) 2.5 MG tablet Take 2.5 mg by mouth every morning. 03/28/15  Yes Historical Provider, MD  metoprolol (LOPRESSOR) 100 MG tablet Take 150 mg by mouth 2 (two) times daily.   Yes Historical Provider, MD  polyethylene glycol powder (GLYCOLAX/MIRALAX) powder Take 17 g by mouth daily as needed. For constipation. Mis with 4-8oz of fluid.   Yes Historical Provider, MD  potassium chloride SA (K-DUR,KLOR-CON) 20 MEQ tablet Take 40 mEq by mouth daily.   Yes Historical Provider, MD  traMADol (ULTRAM) 50 MG tablet Take 25 mg by mouth 3 (three) times daily. Take at 8am, 2pm, and 8pm. 06/21/15  Yes Historical Provider, MD  triamcinolone (NASACORT) 55 MCG/ACT AERO nasal inhaler Place 2 sprays into the nose daily. 06/14/15 06/13/16 Yes Historical Provider, MD  warfarin (COUMADIN) 2.5 MG tablet Take 2.5 mg by mouth daily.   Yes Historical Provider, MD   Physical Exam: Filed Vitals:   09/15/15 0913 09/15/15 1024  BP: 127/79 125/91  Pulse: 110 102  Temp: 97.8 F (36.6 C)   TempSrc: Oral   Resp: 18 18  Height:  (1.651 m)   Weight: 66.543 kg (146 lb 11.2 oz)   SpO2: 92% 93%     General:  No apparent distress  Eyes: PERRL, EOMI, no scleral icterus  ENT: moist oropharynx  Neck: supple, no lymphadenopathy  Cardiovascular:  irregularly irregular without MRG; 2+ peripheral pulses, no JVD, no peripheral edema  Respiratory: CTA biL, good air movement without wheezing, rhonchi or crackled  Abdomen: soft, non tender to palpation, positive bowel sounds, no guarding, no rebound  Skin: no rashes  Musculoskeletal: normal bulk and tone, no joint swelling  Psychiatric: normal mood and  affect  Neurologic: CN 2-12 grossly intact, MS 5/5 in all 4  Labs on Admission:  Basic Metabolic Panel: No results for input(s): NA, K, CL, CO2, GLUCOSE, BUN, CREATININE, CALCIUM, MG, PHOS in the last 168 hours. Liver Function Tests: No results for input(s): AST, ALT, ALKPHOS, BILITOT, PROT, ALBUMIN in the last 168 hours. No results for input(s): LIPASE, AMYLASE in the last 168 hours. No results for input(s): AMMONIA in the last 168 hours. CBC: No results for input(s): WBC, NEUTROABS, HGB, HCT, MCV, PLT in the last 168 hours. Cardiac Enzymes: No results for input(s): CKTOTAL, CKMB, CKMBINDEX, TROPONINI in the last 168 hours.  BNP (last 3 results) No results for input(s): BNP in the last 8760 hours.  ProBNP (last 3 results) No results for input(s): PROBNP in the last 8760 hours.  CBG: No results for input(s): GLUCAP in the last 168 hours.  Radiological Exams on Admission: Dg Chest 1 View  09/15/2015  CLINICAL DATA:  Right hip fracture.  Preop respiratory exam. EXAM: CHEST 1 VIEW COMPARISON:  02/19/2015 FINDINGS: Mild to moderate cardiomegaly stable. No evidence of pulmonary edema or other infiltrate. No evidence of pleural effusion. IMPRESSION: Cardiomegaly.  No active lung disease. Electronically Signed   By: Myles RosenthalJohn  Stahl M.D.   On: 09/15/2015 10:11   Dg Hip Unilat With Pelvis 2-3 Views Right  09/15/2015  CLINICAL DATA:  81 year old female with history of trauma from a fall while walking complaining of right-sided hip pain. EXAM: DG HIP (WITH OR WITHOUT PELVIS) 2-3V RIGHT COMPARISON:  No priors. FINDINGS: No acute displaced fracture of the bony pelvic ring. There is a comminuted intertrochanteric fracture of the right femur with approximately 45 degrees of varus angulation, as well as some proximal migration of the fracture and lateral displacement. Right femoral head remains properly located. IMPRESSION: 1. Comminuted intertrochanteric fracture of the right femur with proximal  migration and lateral displacement as well as approximately 45 degrees of varus angulation. Electronically Signed   By: Trudie Reedaniel  Entrikin M.D.   On: 09/15/2015 10:09    EKG: Independently reviewed.  Assessment/Plan Active Problems:   Hip fracture, right [facility](HCC)   Atrial fibrillation (HCC)   Will admit to floor and check labs including PT/INR. Consult Ortho. Pain meds as needed.  Diet: clear liquids Fluids: NS@75  DVT Prophylaxis: none  Code Status: DNR  Family Communication: yes  Disposition Plan: SNF  Time spent: 55 min

## 2015-09-15 NOTE — Care Management Note (Addendum)
Case Management Note  Patient Details  Name: [name]: [id number] Date of Birth: 08/08/1928  Subjective/Objective:      81yo [name] was [facility] with [facility] pain. Dx: Right hip fracture. Seen by Dr Martha ClanKrasinski but surgery is delayed until INR of 2.0 is below 1.5. Hx of chronic A-fib and Dementia. Followed by Social Work for anticipated SNF placement.               Action/Plan:   Expected Discharge Date:                  Expected Discharge Plan:     In-House Referral:     Discharge planning Services     Post Acute Care Choice:    Choice offered to:     DME Arranged:    DME Agency:     HH Arranged:    HH Agency:     Status of Service:     Medicare Important Message Given:    Date Medicare IM Given:    Medicare IM give by:    Date Additional Medicare IM Given:    Additional Medicare Important Message give by:     If discussed at Long Length of Stay Meetings, dates discussed:    Additional Comments:  Derwood Becraft A, RN 09/15/2015, 3:09 PM

## 2015-09-15 NOTE — ED Provider Notes (Signed)
[facility] Emergency Department Provider Note   ____________________________________________  Time seen: On EMS arrival  I have reviewed the triage vital signs and the nursing notes.   HISTORY  Chief Complaint Fall, right hip pain  History limited by: Dementia   HPI Lydia [name] is a 81 y.o. female with history of dementia who presents to the emergency department today after a fall and concern for right hip pain. The patient states she was coming out of her room when she fell. Denies any chest pain or shortness of breath with the fall. Caretaker however states that the patient has had some issues with low blood pressure recently and is unsure if she might not dizzy. Patient did not hit her head or have any loss of consciousness with the fall. She does have external rotation and shortening of the right leg. She complains of some right hip pain.   Past Medical History  Diagnosis Date  . Hypertension   . A-fib     Patient Active Problem List   Diagnosis Date Noted  . Hernia, inguinal, right 10/10/2013    Past Surgical History  Procedure Laterality Date  . Appendectomy    . Abdominal hysterectomy  1964  . Hernia repair Right 10/19/13    inguinal    Current Outpatient Rx  Name  Route  Sig  Dispense  Refill  . furosemide (LASIX) 20 MG tablet   Oral   Take 1 tablet by mouth daily.         Marland Kitchen lisinopril (PRINIVIL,ZESTRIL) 5 MG tablet   Oral   Take 0.5 tablets by mouth daily.         . metoprolol (LOPRESSOR) 100 MG tablet   Oral   Take 1 tablet by mouth 2 (two) times daily.         . potassium chloride SA (K-DUR,KLOR-CON) 20 MEQ tablet   Oral   Take 1 tablet by mouth daily.         Marland Kitchen warfarin (COUMADIN) 2.5 MG tablet   Oral   Take 1 tablet by mouth daily.           Allergies Review of patient's allergies indicates no known allergies.  Family History  Problem Relation Age of Onset  . Cervical cancer Mother   . Lung  cancer Brother     Social History Social History  Substance Use Topics  . Smoking status: Never Smoker   . Smokeless tobacco: Never Used  . Alcohol Use: No    Review of Systems  Constitutional: Negative for fever. Cardiovascular: Negative for chest pain. Respiratory: Negative for shortness of breath. Gastrointestinal: Negative for abdominal pain, vomiting and diarrhea. Genitourinary: Negative for dysuria. Musculoskeletal: Negative for back pain. Positive for right pain Skin: Negative for rash. Neurological: Negative for headaches, focal weakness or numbness.  10-point ROS otherwise negative.  ____________________________________________   PHYSICAL EXAM:  VITAL SIGNS:   97.8 F (36.6 C)   110  18  127/79 mmHg  92 %     Constitutional: Awake and alert. In no acute distress.  Eyes: Conjunctivae are normal. PERRL. Normal extraocular movements. ENT   Head: Normocephalic and atraumatic.   Nose: No congestion/rhinnorhea.   Mouth/Throat: Mucous membranes are moist.   Neck: No stridor. Hematological/Lymphatic/Immunilogical: No cervical lymphadenopathy. Cardiovascular: Normal rate, regular rhythm.  No murmurs, rubs, or gallops. Respiratory: Normal respiratory effort without tachypnea nor retractions. Breath sounds are clear and equal bilaterally. No wheezes/rales/rhonchi. Gastrointestinal: Soft and nontender. No distention.  Genitourinary:  Deferred Musculoskeletal: Tenderness to palpation of the right hip and manipulation of the right hip. Right leg externally rotated and shortened.  Neurologic:  Normal speech and language. No gross focal neurologic deficits are appreciated. Speech is normal.  Skin:  Skin is warm, dry and intact. No rash noted. Psychiatric: Mood and affect are normal. Speech and behavior are normal. Patient exhibits appropriate insight and judgment.  ____________________________________________    LABS (pertinent  positives/negatives)  Pending  ____________________________________________   EKG  Lurline IdolI, Orlean Holtrop, attending physician, personally viewed and interpreted this EKG  EKG Time: 0916 Rate: 96 Rhythm: AFib Axis: normal Intervals: qtc 442 QRS: narrow ST changes: no st elevation Impression: abnormal ekg  ____________________________________________    RADIOLOGY  CXR IMPRESSION: Cardiomegaly. No active lung disease.  Right Hip IMPRESSION: 1. Comminuted intertrochanteric fracture of the right femur with proximal migration and lateral displacement as well as approximately 45 degrees of varus angulation.  I, Elya Tarquinio, personally viewed and evaluated these images (plain radiographs) as part of my medical decision making. ____________________________________________   PROCEDURES  Procedure(s) performed: None  Critical Care performed: No  ____________________________________________   INITIAL IMPRESSION / ASSESSMENT AND PLAN / ED COURSE  Pertinent labs & imaging results that were available during my care of the patient were reviewed by me and considered in my medical decision making (see chart for details).  Patient presented to the emergency department today after a fall. The right leg was externally rotated and shortened with tenderness to palpation manipulation of the hip. X-rays confirmed a right hip fracture. Will plan on admission.  ____________________________________________   FINAL CLINICAL IMPRESSION(S) / ED DIAGNOSES  Final diagnoses:  Hip fracture, right, closed, initial encounter (HCC)     Phineas SemenGraydon Pepper Wyndham, MD 09/15/15 1018

## 2015-09-15 NOTE — Progress Notes (Signed)
*  PRELIMINARY RESULTS* Echocardiogram 2D Echocardiogram has been performed.  Lydia [name] 09/15/2015, 2:38 PM

## 2015-09-15 NOTE — Consult Note (Signed)
ORTHOPAEDIC CONSULTATION  REQUESTING PHYSICIAN: Adrian Saran, MD  Chief Complaint: Right hip pain status post fall  HPI: Lydia [name] is a 81 y.o. female who complains of right hip pain status post fall. Patient has dementia and is unable to provide accurate history regarding the mechanism of injury. Her daughters are at the bedside. They report the patient is walking without her walker which she uses normally at baseline for assistance with ambulation. Patient has a history of atrial fibrillation and is on Coumadin at baseline.  Past Medical History  Diagnosis Date  . Hypertension   . A-fib [facility])    Past Surgical History  Procedure Laterality Date  . Appendectomy    . Abdominal hysterectomy  1964  . Hernia repair Right 10/19/13    inguinal   Social History   Social History  . Marital Status: Married    Spouse Name: N/A  . Number of Children: N/A  . Years of Education: N/A   Social History Main Topics  . Smoking status: Never Smoker   . Smokeless tobacco: Never Used  . Alcohol Use: No  . Drug Use: No  . Sexual Activity: Not Asked   Other Topics Concern  . None   Social History Narrative   Family History  Problem Relation Age of Onset  . Cervical cancer Mother   . Lung cancer Brother    No Known Allergies Prior to Admission medications   Medication Sig Start Date End Date Taking? Authorizing Provider  acetaminophen (TYLENOL) 325 MG tablet Take 650 mg by mouth every 4 (four) hours as needed for mild pain or moderate pain.   Yes Historical Provider, MD  barrier cream (NON-SPECIFIED) CREA Apply 1 application topically 2 (two) times daily. Apply to buttocks.   Yes Historical Provider, MD  calcitonin, salmon, (MIACALCIN/FORTICAL) 200 UNIT/ACT nasal spray Place 1 spray into alternate nostrils daily. 03/08/15  Yes Historical Provider, MD  citalopram (CELEXA) 10 MG tablet Take 10 mg by mouth daily. 03/01/15 02/29/16 Yes Historical Provider, MD  Fluticasone-Salmeterol (ADVAIR  DISKUS) 100-50 MCG/DOSE AEPB Inhale 1 puff into the lungs 2 (two) times daily.   Yes Historical Provider, MD  furosemide (LASIX) 40 MG tablet Take 40 mg by mouth every other day. 04/02/15  Yes Historical Provider, MD  lisinopril (PRINIVIL,ZESTRIL) 2.5 MG tablet Take 2.5 mg by mouth every morning. 03/28/15  Yes Historical Provider, MD  metoprolol (LOPRESSOR) 100 MG tablet Take 150 mg by mouth 2 (two) times daily.   Yes Historical Provider, MD  polyethylene glycol powder (GLYCOLAX/MIRALAX) powder Take 17 g by mouth daily as needed. For constipation. Mis with 4-8oz of fluid.   Yes Historical Provider, MD  potassium chloride SA (K-DUR,KLOR-CON) 20 MEQ tablet Take 40 mEq by mouth daily.   Yes Historical Provider, MD  traMADol (ULTRAM) 50 MG tablet Take 25 mg by mouth 3 (three) times daily. Take at 8am, 2pm, and 8pm. 06/21/15  Yes Historical Provider, MD  triamcinolone (NASACORT) 55 MCG/ACT AERO nasal inhaler Place 2 sprays into the nose daily. 06/14/15 06/13/16 Yes Historical Provider, MD  warfarin (COUMADIN) 2.5 MG tablet Take 2.5 mg by mouth daily.   Yes Historical Provider, MD   Dg Chest 1 View  09/15/2015  CLINICAL DATA:  Right hip fracture.  Preop respiratory exam. EXAM: CHEST 1 VIEW COMPARISON:  02/19/2015 FINDINGS: Mild to moderate cardiomegaly stable. No evidence of pulmonary edema or other infiltrate. No evidence of pleural effusion. IMPRESSION: Cardiomegaly.  No active lung disease. Electronically Signed   By:  Myles RosenthalJohn  Stahl M.D.   On: 09/15/2015 10:11   Dg Hip Unilat With Pelvis 2-3 Views Right  09/15/2015  CLINICAL DATA:  81 year old female with history of trauma from a fall while walking complaining of right-sided hip pain. EXAM: DG HIP (WITH OR WITHOUT PELVIS) 2-3V RIGHT COMPARISON:  No priors. FINDINGS: No acute displaced fracture of the bony pelvic ring. There is a comminuted intertrochanteric fracture of the right femur with approximately 45 degrees of varus angulation, as well as some proximal  migration of the fracture and lateral displacement. Right femoral head remains properly located. IMPRESSION: 1. Comminuted intertrochanteric fracture of the right femur with proximal migration and lateral displacement as well as approximately 45 degrees of varus angulation. Electronically Signed   By: Trudie Reedaniel  Entrikin M.D.   On: 09/15/2015 10:09    Positive ROS: All other systems have been reviewed and were otherwise negative with the exception of those mentioned in the HPI and as above.  Physical Exam: General: Alert, no acute distress  MUSCULOSKELETAL: Right lower extremity: Skin overlying the patient's right hip is intact. There is no erythema, ecchymosis or significant swelling. The thigh and leg compartments are soft and compressible. Her right lower extremity is shortened and significantly externally rotated. She has intact sensation light touch throughout the right lower extremity. She has palpable pedal pulses. She can flex and extend her toes. Her right lower extremity is in Buck's traction.  Assessment: Closed, right intertrochanteric hip fracture, displaced  Plan: I explained the injury to the patient's daughters. I used the patient's white board in her room to diagram the fracture and the proposed surgery. I am recommending an intramedullary fixation for her closed, displaced intertrochanteric hip fracture. There is significant displacement and shortening.  I explained in detail the operation as well as the postoperative course. Patient will be in the hospital 3 days postoperatively.  She will require skilled nursing facility stay upon discharge.  The risks, benefits of intramedullary fixation and treatment alternatives were discussed with the patient's daughters.  They understand  risks include but are not limited to infection, bleeding, nerve or blood vessel injury, malunion, nonunion,  and persistent hip pain, hardware prominence, hardware failure, change in leg lengths or lower  extremity rotation need for further surgery including hardware removal with conversion to a total hip arthroplasty. Medical risks include but are not limited to DVT and pulmonary embolism, myocardial infarction, stroke, pneumonia, respiratory failure and death. The patient's  daughters understood these risks and wished to proceed with surgery.   Patient's INR today was 2.11.  I have ordered vitamin K to be given 10 mg IV and in an attempt to lower the patient's INR.  Patient may 8 today. She'll be nothing by mouth after midnight. She will need an INR redrawn in the morning. Plan for surgery tomorrow if her INR is below 1.5.   Juanell FairlyKRASINSKI, Trudy Kory, MD    09/15/2015 5:38 PM

## 2015-09-16 ENCOUNTER — Inpatient Hospital Stay: Payer: Medicare Other

## 2015-09-16 ENCOUNTER — Inpatient Hospital Stay: Payer: Medicare Other | Admitting: Anesthesiology

## 2015-09-16 ENCOUNTER — Encounter: Payer: Self-pay | Admitting: Orthopedic Surgery

## 2015-09-16 ENCOUNTER — Encounter
Admission: EM | Disposition: A | Discharge status: Skilled Nursing Facility | Payer: Self-pay | Source: Home / Self Care | Attending: Internal Medicine

## 2015-09-16 DIAGNOSIS — L8990 Pressure ulcer of unspecified site, unspecified stage: Secondary | ICD-10-CM | POA: Insufficient documentation

## 2015-09-16 HISTORY — PX: FEMUR IM NAIL: SHX1597

## 2015-09-16 LAB — BASIC METABOLIC PANEL
ANION GAP: 8 (ref 5–15)
BUN: 32 mg/dL — ABNORMAL HIGH (ref 6–20)
CALCIUM: 9.1 mg/dL (ref 8.9–10.3)
CO2: 26 mmol/L (ref 22–32)
Chloride: 109 mmol/L (ref 101–111)
Creatinine, Ser: 1.14 mg/dL — ABNORMAL HIGH (ref 0.44–1.00)
GFR, EST AFRICAN AMERICAN: 49 mL/min — AB (ref >60)
GFR, EST NON AFRICAN AMERICAN: 43 mL/min — AB (ref >60)
Glucose, Bld: 145 mg/dL — ABNORMAL HIGH (ref 65–99)
POTASSIUM: 4.8 mmol/L (ref 3.5–5.1)
Sodium: 143 mmol/L (ref 135–145)

## 2015-09-16 LAB — CBC WITH DIFFERENTIAL/PLATELET
BASOS PCT: 1 %
Basophils Absolute: 0 10*3/uL (ref 0–0.1)
EOS ABS: 0 10*3/uL (ref 0–0.7)
Eosinophils Relative: 0 %
HEMATOCRIT: 36 % (ref 35.0–47.0)
HEMOGLOBIN: 12 g/dL (ref 12.0–16.0)
LYMPHS ABS: 1 10*3/uL (ref 1.0–3.6)
Lymphocytes Relative: 12 %
MCH: 31.2 pg (ref 26.0–34.0)
MCHC: 33.2 g/dL (ref 32.0–36.0)
MCV: 93.9 fL (ref 80.0–100.0)
MONOS PCT: 14 %
Monocytes Absolute: 1.2 10*3/uL — ABNORMAL HIGH (ref 0.2–0.9)
NEUTROS ABS: 6 10*3/uL (ref 1.4–6.5)
NEUTROS PCT: 73 %
Platelets: 121 10*3/uL — ABNORMAL LOW (ref 150–440)
RBC: 3.83 MIL/uL (ref 3.80–5.20)
RDW: 15.2 % — ABNORMAL HIGH (ref 11.5–14.5)
WBC: 8.2 10*3/uL (ref 3.6–11.0)

## 2015-09-16 LAB — URINE CULTURE: CULTURE: NO GROWTH

## 2015-09-16 LAB — PROTIME-INR
INR: 1.45
PROTHROMBIN TIME: 17.8 s — AB (ref 11.4–15.0)

## 2015-09-16 LAB — VITAMIN D 25 HYDROXY (VIT D DEFICIENCY, FRACTURES): Vit D, 25-Hydroxy: 16 ng/mL — ABNORMAL LOW (ref 30.0–100.0)

## 2015-09-16 SURGERY — INSERTION, INTRAMEDULLARY ROD, FEMUR
Anesthesia: General | Site: Hip | Laterality: Right | Wound class: Clean

## 2015-09-16 MED ORDER — WARFARIN SODIUM 5 MG PO TABS: 5.0000 mg | ORAL_TABLET | Freq: Once | ORAL | Status: DC

## 2015-09-16 MED ORDER — MORPHINE SULFATE (PF) 2 MG/ML IV SOLN: 2.0000 mg | INTRAVENOUS | Status: DC | PRN

## 2015-09-16 MED ORDER — BISACODYL 10 MG RE SUPP: 10.0000 mg | Freq: Every day | RECTAL | Status: DC | PRN

## 2015-09-16 MED ORDER — ALUM & MAG HYDROXIDE-SIMETH 200-200-20 MG/5ML PO SUSP: 30.0000 mL | ORAL | Status: DC | PRN

## 2015-09-16 MED ORDER — LACTATED RINGERS IV SOLN
INTRAVENOUS | Status: DC | PRN
  Administered 2015-09-16: 13:00:00 via INTRAVENOUS

## 2015-09-16 MED ORDER — ONDANSETRON HCL 4 MG/2ML IJ SOLN
INTRAMUSCULAR | Status: DC | PRN
  Administered 2015-09-16: 4 mg via INTRAVENOUS

## 2015-09-16 MED ORDER — CEFAZOLIN SODIUM-DEXTROSE 2-3 GM-% IV SOLR
INTRAVENOUS | Status: DC | PRN
  Administered 2015-09-16: 2 g via INTRAVENOUS

## 2015-09-16 MED ORDER — DOCUSATE SODIUM 100 MG PO CAPS
100.0000 mg | ORAL_CAPSULE | Freq: Two times a day (BID) | ORAL | Status: DC
Start: 2015-09-16 — End: 2015-09-21
  Administered 2015-09-16 – 2015-09-20 (×7): 100 mg via ORAL
  Filled 2015-09-16 (×8): qty 1

## 2015-09-16 MED ORDER — WARFARIN SODIUM 5 MG PO TABS
5.0000 mg | ORAL_TABLET | Freq: Once | ORAL | Status: AC
  Administered 2015-09-16: 5 mg via ORAL
  Filled 2015-09-16: qty 1

## 2015-09-16 MED ORDER — MAGNESIUM CITRATE PO SOLN: 1.0000 | Freq: Once | ORAL | Status: DC | PRN

## 2015-09-16 MED ORDER — MENTHOL 3 MG MT LOZG: 1.0000 | LOZENGE | OROMUCOSAL | Status: DC | PRN

## 2015-09-16 MED ORDER — PHENYLEPHRINE HCL 10 MG/ML IJ SOLN
INTRAMUSCULAR | Status: DC | PRN
  Administered 2015-09-16 (×4): 200 ug via INTRAVENOUS
  Administered 2015-09-16: 100 ug via INTRAVENOUS

## 2015-09-16 MED ORDER — ONDANSETRON HCL 4 MG/2ML IJ SOLN: 4.0000 mg | Freq: Four times a day (QID) | INTRAMUSCULAR | Status: DC | PRN

## 2015-09-16 MED ORDER — CEFAZOLIN SODIUM 1 G IJ SOLR
INTRAMUSCULAR | Status: AC
  Filled 2015-09-16: qty 20

## 2015-09-16 MED ORDER — ROCURONIUM BROMIDE 100 MG/10ML IV SOLN
INTRAVENOUS | Status: DC | PRN
  Administered 2015-09-16: 35 mg via INTRAVENOUS
  Administered 2015-09-16: 10 mg via INTRAVENOUS

## 2015-09-16 MED ORDER — CEFAZOLIN SODIUM-DEXTROSE 2-3 GM-% IV SOLR
2.0000 g | Freq: Four times a day (QID) | INTRAVENOUS | Status: AC
  Administered 2015-09-16 (×2): 2 g via INTRAVENOUS
  Filled 2015-09-16 (×2): qty 50

## 2015-09-16 MED ORDER — PHENOL 1.4 % MT LIQD: 1.0000 | OROMUCOSAL | Status: DC | PRN

## 2015-09-16 MED ORDER — ONDANSETRON HCL 4 MG PO TABS: 4.0000 mg | ORAL_TABLET | Freq: Four times a day (QID) | ORAL | Status: DC | PRN

## 2015-09-16 MED ORDER — SODIUM CHLORIDE 0.9 % IV SOLN
75.0000 mL/h | INTRAVENOUS | Status: DC
  Administered 2015-09-16 – 2015-09-17 (×2): 75 mL/h via INTRAVENOUS

## 2015-09-16 MED ORDER — FERROUS SULFATE 325 (65 FE) MG PO TABS
325.0000 mg | ORAL_TABLET | Freq: Three times a day (TID) | ORAL | Status: DC
Start: 2015-09-16 — End: 2015-09-21
  Administered 2015-09-17 – 2015-09-21 (×11): 325 mg via ORAL
  Filled 2015-09-16 (×12): qty 1

## 2015-09-16 MED ORDER — ACETAMINOPHEN 650 MG RE SUPP: 650.0000 mg | Freq: Four times a day (QID) | RECTAL | Status: DC | PRN

## 2015-09-16 MED ORDER — NEOMYCIN-POLYMYXIN B GU 40-200000 IR SOLN
Status: DC | PRN
  Administered 2015-09-16: 4 mL

## 2015-09-16 MED ORDER — ACETAMINOPHEN 325 MG PO TABS
650.0000 mg | ORAL_TABLET | Freq: Four times a day (QID) | ORAL | Status: DC | PRN
  Administered 2015-09-17 – 2015-09-20 (×6): 650 mg via ORAL
  Filled 2015-09-16 (×5): qty 2

## 2015-09-16 MED ORDER — POLYETHYLENE GLYCOL 3350 17 G PO PACK
17.0000 g | PACK | Freq: Every day | ORAL | Status: DC | PRN
  Administered 2015-09-18: 17 g via ORAL
  Filled 2015-09-16 (×2): qty 1

## 2015-09-16 MED ORDER — NEOMYCIN-POLYMYXIN B GU 40-200000 IR SOLN
Status: AC
  Filled 2015-09-16: qty 4

## 2015-09-16 MED ORDER — WARFARIN - PHYSICIAN DOSING INPATIENT: Freq: Every day | Status: DC

## 2015-09-16 MED ORDER — FENTANYL CITRATE (PF) 100 MCG/2ML IJ SOLN
INTRAMUSCULAR | Status: DC | PRN
  Administered 2015-09-16: 100 ug via INTRAVENOUS

## 2015-09-16 MED ORDER — SUGAMMADEX SODIUM 500 MG/5ML IV SOLN
INTRAVENOUS | Status: DC | PRN
  Administered 2015-09-16: 100 mg via INTRAVENOUS

## 2015-09-16 MED ORDER — APIXABAN 5 MG PO TABS
5.0000 mg | ORAL_TABLET | Freq: Two times a day (BID) | ORAL | Status: DC
  Administered 2015-09-17 – 2015-09-21 (×8): 5 mg via ORAL
  Filled 2015-09-16 (×8): qty 1

## 2015-09-16 MED ORDER — OXYCODONE HCL 5 MG PO TABS: 5.0000 mg | ORAL_TABLET | ORAL | Status: DC | PRN

## 2015-09-16 MED ORDER — LIDOCAINE HCL (CARDIAC) 20 MG/ML IV SOLN
INTRAVENOUS | Status: DC | PRN
  Administered 2015-09-16: 100 mg via INTRAVENOUS

## 2015-09-16 MED ORDER — PROPOFOL 10 MG/ML IV BOLUS
INTRAVENOUS | Status: DC | PRN
  Administered 2015-09-16: 100 mg via INTRAVENOUS

## 2015-09-16 MED ORDER — SENNA 8.6 MG PO TABS
1.0000 | ORAL_TABLET | Freq: Two times a day (BID) | ORAL | Status: DC
  Administered 2015-09-16 – 2015-09-21 (×8): 8.6 mg via ORAL
  Filled 2015-09-16 (×8): qty 1

## 2015-09-16 SURGICAL SUPPLY — 31 items
BIT DRILL 4.3MMS DISTAL GRDTED (BIT) ×1 IMPLANT
BNDG COHESIVE 6X5 TAN STRL LF (GAUZE/BANDAGES/DRESSINGS) ×3 IMPLANT
CANISTER SUCT 1200ML W/VALVE (MISCELLANEOUS) ×3 IMPLANT
DRAPE SURG 17X11 SM STRL (DRAPES) ×3 IMPLANT
DRAPE U-SHAPE 47X51 STRL (DRAPES) ×3 IMPLANT
DRILL 4.3MMS DISTAL GRADUATED (BIT) ×3
DRSG OPSITE POSTOP 4X14 (GAUZE/BANDAGES/DRESSINGS) ×3 IMPLANT
DURAPREP 26ML APPLICATOR (WOUND CARE) ×3 IMPLANT
GLOVE BIOGEL PI IND STRL 9 (GLOVE) ×1 IMPLANT
GLOVE BIOGEL PI INDICATOR 9 (GLOVE) ×2
GLOVE SURG 9.0 ORTHO LTXF (GLOVE) ×6 IMPLANT
GOWN STRL REUS TWL 2XL XL LVL4 (GOWN DISPOSABLE) ×3 IMPLANT
GOWN STRL REUS W/ TWL LRG LVL3 (GOWN DISPOSABLE) ×1 IMPLANT
GOWN STRL REUS W/TWL LRG LVL3 (GOWN DISPOSABLE) ×2
GUIDEPIN VERSANAIL DSP 3.2X444 ×6 IMPLANT
GUIDEWIRE BALL NOSE 100CM (WIRE) ×3 IMPLANT
HEMOVAC 400CC 10FR (MISCELLANEOUS) ×3 IMPLANT
KIT RM TURNOVER CYSTO AR (KITS) ×3 IMPLANT
MAT BLUE FLOOR 46X72 FLO (MISCELLANEOUS) ×3 IMPLANT
NAIL HIP FRAC RT 130 11MX400M (Nail) ×3 IMPLANT
NS IRRIG 1000ML POUR BTL (IV SOLUTION) ×3 IMPLANT
PACK HIP COMPR (MISCELLANEOUS) ×3 IMPLANT
PAD GROUND ADULT SPLIT (MISCELLANEOUS) ×3 IMPLANT
SCREW BONE CORTICAL 5.0X50 (Screw) ×3 IMPLANT
SCREW LAG 10.5MMX105MM HFN (Screw) ×3 IMPLANT
STAPLER SKIN PROX 35W (STAPLE) ×3 IMPLANT
SUCTION FRAZIER TIP 10 FR DISP (SUCTIONS) ×3 IMPLANT
SUT VIC AB 2-0 CT1 27 (SUTURE) ×4
SUT VIC AB 2-0 CT1 TAPERPNT 27 (SUTURE) ×2 IMPLANT
SUT VICRYL 0 AB UR-6 (SUTURE) ×3 IMPLANT
SYR 30ML LL (SYRINGE) ×3 IMPLANT

## 2015-09-16 NOTE — Anesthesia Postprocedure Evaluation (Signed)
  Anesthesia Post-op Note  Patient: Lydia [name]  Procedure(s) Performed: Procedure(s): INTRAMEDULLARY (IM) NAIL FEMORAL (Right)  Anesthesia type:General  Patient location: PACU  Post pain: Pain level controlled  Post assessment: Post-op Vital signs reviewed, Patient's Cardiovascular Status Stable, Respiratory Function Stable, Patent Airway and No signs of Nausea or vomiting  Post vital signs: Reviewed and stable  Last Vitals:  Filed Vitals:   09/16/15 1000  BP:   Pulse: 93  Temp:   Resp:     Level of consciousness: awake, alert  and patient cooperative  Complications: No apparent anesthesia complications

## 2015-09-16 NOTE — Care Management Important Message (Signed)
Important Message  Patient Details  Name: Jeraldine LootsBetty G Peacock MRN: [id number] Date of Birth: 09/25/1928   Medicare Important Message Given:  Yes-second notification given    Collie Siadngela Cesar Alf, RN 09/16/2015, 9:34 AM

## 2015-09-16 NOTE — Clinical Social Work Note (Signed)
Clinical Social Work Assessment  Patient Details  Name: Lydia [name] MRN: [id number] Date of Birth: [birth year]  Date of referral:  09/16/15               Reason for consult:  Facility Placement, Other (Comment Required) (From [facility] )                Permission sought to share information with:  Chartered certified accountant granted to share information::  Yes, Verbal Permission Granted  Name::      [place]::   [place]   Relationship::     Contact Information:     Housing/Transportation Living arrangements for the past 2 months:  [place] of Information:  [place], Adult Children Patient Interpreter Needed:  None Criminal Activity/Legal Involvement Pertinent to Current Situation/Hospitalization:  No - Comment as needed Significant Relationships:  Adult Children Lives with:  Facility Resident Do you feel safe going back to the place where you live?  Yes Need for family participation in patient care:  Yes (Comment)  Care giving concerns: Patient is a resident at [facility] in the memory care section.    Social Worker assessment / plan: Patient is having surgery for a hip fracture today. Clinical Social Worker (CSW) met with patient and her daughter Calisa Luckenbaugh HPOA [phone] prior to surgery today. CSW introduced self and explained role of CSW department. Patient was asleep during assessment. Daughter reported that patient have been living at [facility] in the memory care section for almost 2 years. It will be 2 years in January 2017. Daughter reported that patient had a bad UTI in March 2016 and went to [facility] from [facility] for 1 day. Daughter reported that patient's mental status cleared up significantly and she was not having a good experience at [facility] so they took her back to [facility]. CSW explained that PT will evaluate and make a recommendation after surgery of home  health or SNF. Daughter is agreeable to SNF search and prefers [facility]   FL2 complete and faxed out.   Employment status:  Disabled (Comment on whether or not currently receiving Disability), Retired Nurse, adult PT Recommendations:  Not assessed at this time Information / Referral to community resources:  [place]  Patient/Family's Response to care: Patient's daughter is agreeable to [facility] and prefers [facility]   Patient/Family's Understanding of and Emotional Response to Diagnosis, Current Treatment, and Prognosis: Daughter was pleasant throughout assessment and thanked CSW for visit.   Emotional Assessment Appearance:  Appears stated age Attitude/Demeanor/Rapport:  Lethargic Affect (typically observed):  Flat Orientation:  Fluctuating Orientation (Suspected and/or reported Sundowners) Alcohol / Substance use:  Not Applicable Psych involvement (Current and /or in the community):  No (Comment)  Discharge Needs  Concerns to be addressed:  Discharge Planning Concerns Readmission within the last 30 days:  No Current discharge risk:  Cognitively Impaired Barriers to Discharge:  Continued Medical Work up   Loralyn Freshwater, LCSW 09/16/2015, 11:23 AM

## 2015-09-16 NOTE — Progress Notes (Addendum)
Patient ID: Lydia LootsBetty G Huard, female   DOB: 07/10/1928, 81 y.o.   MRN: [id number] [facility] PROGRESS NOTE  PCP: Clydie BraunFITZGERALD, DAVID, MD  HPI/Subjective: Patient feeling some pain in the hip and soreness in the chest. Cannot tell me the details of the fall. No complaints of chest pain or shortness of breath  Objective: Filed Vitals:   09/16/15 0721  BP: 133/80  Pulse: 101  Temp: 98.8 F (37.1 C)  Resp: 14    Filed Weights   09/15/15 0913 09/15/15 1200 09/16/15 0552  Weight: 66.543 kg (146 lb 11.2 oz) 66.996 kg (147 lb 11.2 oz) 67.405 kg (148 lb 9.6 oz)    ROS: Review of Systems  Constitutional: Negative for fever and chills.  Eyes: Negative for blurred vision.  Respiratory: Negative for cough and shortness of breath.   Cardiovascular: Negative for chest pain.  Gastrointestinal: Negative for nausea, vomiting and abdominal pain.  Genitourinary: Negative for dysuria.  Musculoskeletal: Negative for joint pain.  Neurological: Negative for dizziness and headaches.  Endo/Heme/Allergies: Bruises/bleeds easily.   Exam: Physical Exam  HENT:  Nose: No mucosal edema.  Mouth/Throat: No oropharyngeal exudate or posterior oropharyngeal edema.  Eyes: Conjunctivae and lids are normal. Pupils are equal, round, and reactive to light.  Neck: No JVD present. Carotid bruit is not present. No edema present. No thyroid mass and no thyromegaly present.  Cardiovascular: S1 normal and S2 normal.  An irregularly irregular rhythm present. Exam reveals no gallop.   Murmur heard.  Systolic murmur is present with a grade of 2/6  Pulses:      Dorsalis pedis pulses are 2+ on the right side, and 2+ on the left side.  Respiratory: No respiratory distress. She has no wheezes. She has no rhonchi. She has no rales.  GI: Soft. Bowel sounds are normal. There is no tenderness.  Musculoskeletal:       Right ankle: She exhibits no swelling.       Left ankle: She exhibits no swelling.   Lymphadenopathy:    She has no cervical adenopathy.  Neurological: She is alert. No cranial nerve deficit.  Intact sensation bilateral lower extremities. Patient able to wiggle toes bilaterally  Skin: Skin is warm. Nails show no clubbing.  Nursing staff documenting a stage I decubitus ulcer on buttock. No skin breakdown but discoloration of skin. Patient also has bruising upper right chest and neck.  Psychiatric: She has a normal mood and affect.    Data Reviewed: Basic Metabolic Panel:  Recent Labs Lab 09/15/15 1036 09/16/15 0258  NA 140 143  K 4.5 4.8  CL 108 109  CO2 29 26  GLUCOSE 125* 145*  BUN 31* 32*  CREATININE 1.17* 1.14*  CALCIUM 8.9 9.1   Liver Function Tests:  Recent Labs Lab 09/15/15 1038  ALBUMIN 3.6   CBC:  Recent Labs Lab 09/15/15 1036 09/16/15 0258  WBC 10.0 8.2  NEUTROABS 8.6* 6.0  HGB 12.2 12.0  HCT 36.6 36.0  MCV 93.3 93.9  PLT 140* 121*     Recent Results (from the past 240 hour(s))  Surgical pcr screen     Status: None   Collection Time: 09/15/15  3:23 PM  Result Value Ref Range Status   MRSA, PCR NEGATIVE NEGATIVE Final   Staphylococcus aureus NEGATIVE NEGATIVE Final    Comment:        The Xpert SA Assay (FDA approved for NASAL specimens in patients over 79 years of age), is one component of a comprehensive surveillance  program.  Test performance has been validated by [facility] for patients greater than or equal to 44 year old. It is not intended to diagnose infection nor to guide or monitor treatment.      Studies: Dg Chest 1 View  09/15/2015  CLINICAL DATA:  Right hip fracture.  Preop respiratory exam. EXAM: CHEST 1 VIEW COMPARISON:  02/19/2015 FINDINGS: Mild to moderate cardiomegaly stable. No evidence of pulmonary edema or other infiltrate. No evidence of pleural effusion. IMPRESSION: Cardiomegaly.  No active lung disease. Electronically Signed   By: Myles Rosenthal M.D.   On: 09/15/2015 10:11   Dg Hip Unilat With  Pelvis 2-3 Views Right  09/15/2015  CLINICAL DATA:  81 year old female with history of trauma from a fall while walking complaining of right-sided hip pain. EXAM: DG HIP (WITH OR WITHOUT PELVIS) 2-3V RIGHT COMPARISON:  No priors. FINDINGS: No acute displaced fracture of the bony pelvic ring. There is a comminuted intertrochanteric fracture of the right femur with approximately 45 degrees of varus angulation, as well as some proximal migration of the fracture and lateral displacement. Right femoral head remains properly located. IMPRESSION: 1. Comminuted intertrochanteric fracture of the right femur with proximal migration and lateral displacement as well as approximately 45 degrees of varus angulation. Electronically Signed   By: Trudie Reed M.D.   On: 09/15/2015 10:09    Scheduled Meds: . calcitonin (salmon)  1 spray Alternating Nares Daily  . citalopram  10 mg Oral Daily  . docusate sodium  100 mg Oral BID  . fluticasone  2 spray Each Nare Daily  . lisinopril  2.5 mg Oral [id number]  . metoprolol  150 mg Oral BID  . mometasone-formoterol  2 puff Inhalation BID    Assessment/Plan: 1. Right hip fracture closed requiring operative repair. INR down to 1.45 after vitamin K. No contraindications to surgery at this time. 2. Atrial fibrillation. Continue metoprolol 150 mg twice a day for rate control. Coumadin reversed with IV vitamin K. I will consider Eliquis for anticoagulation after surgery. Higher risk of stroke being off blood thinner explained to the family. 3. History of congestive heart failure- currently no signs of congestive heart failure but careful with fluids intraoperatively and postoperatively. 4. Essential hypertension- blood pressure stable 5. Respiratory status stable continue inhalers 6. History of depression on Celexa 7. Patient in the memory unit at [facility]. 8. Stage I pressure ulcer on buttock as per nursing staff. Dressings applied.   Code Status:     Code Status  Orders        Start     Ordered   09/15/15 1233  Do not attempt resuscitation (DNR)   Continuous    Question Answer Comment  In the event of cardiac or respiratory ARREST Do not call a "code blue"   In the event of cardiac or respiratory ARREST Do not perform Intubation, CPR, defibrillation or ACLS   In the event of cardiac or respiratory ARREST Use medication by any route, position, wound care, and other measures to relive pain and suffering. May use oxygen, suction and manual treatment of airway obstruction as needed for comfort.      09/15/15 1232    Advance Directive Documentation        Most Recent Value   Type of Advance Directive  Out of facility DNR (pink MOST or yellow form)   Pre-existing out of facility DNR order (yellow form or pink MOST form)     "MOST" Form in Place?  Family Communication: Family at bedside Disposition Plan: Potentially to rehabilitation postoperatively day 3. May take longer if we decide to go back on Coumadin.  Consultants:  Orthopedic surgery  Time spent: 25 minutes  Alford Highland  [facility]

## 2015-09-16 NOTE — Clinical Social Work Placement (Signed)
   CLINICAL SOCIAL WORK PLACEMENT  NOTE  Date:  09/16/2015  Patient Details  Name: Lydia [name] MRN: [id number] Date of Birth: 06/04/1928  Clinical Social Work is seeking post-discharge placement for this patient at the Skilled  Nursing Facility level of care (*CSW will initial, date and re-position this form in  chart as items are completed):  Yes   Patient/family provided with [facility] Clinical Social Work Department's list of facilities offering this level of care within the geographic area requested by the patient (or if unable, by the patient's family).  Yes   Patient/family informed of their freedom to choose among providers that offer the needed level of care, that participate in Medicare, Medicaid or managed care program needed by the patient, have an available bed and are willing to accept the patient.  Yes   Patient/family informed of 's ownership interest in [facility] and [facility], as well as of the fact that they are under no obligation to receive care at these facilities.  PASRR submitted to EDS on       PASRR number received on       Existing PASRR number confirmed on 09/16/15     FL2 transmitted to all facilities in geographic area requested by pt/family on 09/16/15     FL2 transmitted to all facilities within larger geographic area on       Patient informed that his/her managed care company has contracts with or will negotiate with certain facilities, including the following:            Patient/family informed of bed offers received.  Patient chooses bed at       Physician recommends and patient chooses bed at      Patient to be transferred to   on  .  Patient to be transferred to facility by       Patient family notified on   of transfer.  Name of family member notified:        PHYSICIAN Please sign FL2     Additional Comment:    _______________________________________________ Haig ProphetMorgan, Erhard Senske G, LCSW 09/16/2015, 11:22  AM

## 2015-09-16 NOTE — Progress Notes (Signed)
Pt INF down to 1.45. MD note showed that patient will have surgery if INR below 1.5. Pt has been NPO this shift.

## 2015-09-16 NOTE — Transfer of Care (Signed)
Immediate Anesthesia Transfer of Care Note  Patient: Lydia LootsBetty G Yawn  Procedure(s) Performed: Procedure(s): INTRAMEDULLARY (IM) NAIL FEMORAL (Right)  Patient Location: PACU  Anesthesia Type:General  Level of Consciousness: awake, alert , oriented and patient cooperative  Airway & Oxygen Therapy: Patient Spontanous Breathing and Patient connected to face mask oxygen  Post-op Assessment: Report given to RN, Post -op Vital signs reviewed and stable and Patient moving all extremities X 4  Post vital signs: Reviewed and stable  Last Vitals:  Filed Vitals:   09/16/15 1000  BP:   Pulse: 93  Temp:   Resp:     Complications: No apparent anesthesia complications

## 2015-09-16 NOTE — Progress Notes (Signed)
Subjective:  POST-OP CHECK:  Patient is status post intramedullary fixation for right intertrochanteric hip fracture. She is sleeping comfortably in bed. Her daughters are at the bedside. Patient is in no distress.  Objective:   VITALS:   Filed Vitals:   09/16/15 1000 09/16/15 1440 09/16/15 1540 09/16/15 1610  BP:    135/96  Pulse: 93   98  Temp:  97.8 F (36.6 C) 98.3 F (36.8 C) 98.1 F (36.7 C)  TempSrc:    Oral  Resp:    16  Height:      Weight:      SpO2:    97%    PHYSICAL EXAM:  Deferred due to patient's sleeping.   LABS  Results for orders placed or performed during the hospital encounter of 09/15/15 (from the past 24 hour(s))  CBC with Differential/Platelet     Status: Abnormal   Collection Time: 09/16/15  2:58 AM  Result Value Ref Range   WBC 8.2 3.6 - 11.0 K/uL   RBC 3.83 3.80 - 5.20 MIL/uL   Hemoglobin 12.0 12.0 - 16.0 g/dL   HCT 16.1 09.6 - 04.5 %   MCV 93.9 80.0 - 100.0 fL   MCH 31.2 26.0 - 34.0 pg   MCHC 33.2 32.0 - 36.0 g/dL   RDW 40.9 (H) 81.1 - 91.4 %   Platelets 121 (L) 150 - 440 K/uL   Neutrophils Relative % 73 %   Neutro Abs 6.0 1.4 - 6.5 K/uL   Lymphocytes Relative 12 %   Lymphs Abs 1.0 1.0 - 3.6 K/uL   Monocytes Relative 14 %   Monocytes Absolute 1.2 (H) 0.2 - 0.9 K/uL   Eosinophils Relative 0 %   Eosinophils Absolute 0.0 0 - 0.7 K/uL   Basophils Relative 1 %   Basophils Absolute 0.0 0 - 0.1 K/uL  Protime-INR     Status: Abnormal   Collection Time: 09/16/15  2:58 AM  Result Value Ref Range   Prothrombin Time 17.8 (H) 11.4 - 15.0 seconds   INR 1.45   Basic metabolic panel     Status: Abnormal   Collection Time: 09/16/15  2:58 AM  Result Value Ref Range   Sodium 143 135 - 145 mmol/L   Potassium 4.8 3.5 - 5.1 mmol/L   Chloride 109 101 - 111 mmol/L   CO2 26 22 - 32 mmol/L   Glucose, Bld 145 (H) 65 - 99 mg/dL   BUN 32 (H) 6 - 20 mg/dL   Creatinine, Ser 7.82 (H) 0.44 - 1.00 mg/dL   Calcium 9.1 8.9 - 95.6 mg/dL   GFR calc non Af  Amer 43 (L) >60 mL/min   GFR calc Af Amer 49 (L) >60 mL/min   Anion gap 8 5 - 15    Dg Chest 1 View  09/15/2015  CLINICAL DATA:  Right hip fracture.  Preop respiratory exam. EXAM: CHEST 1 VIEW COMPARISON:  02/19/2015 FINDINGS: Mild to moderate cardiomegaly stable. No evidence of pulmonary edema or other infiltrate. No evidence of pleural effusion. IMPRESSION: Cardiomegaly.  No active lung disease. Electronically Signed   By: Myles Rosenthal M.D.   On: 09/15/2015 10:11   Dg C-arm 61-120 Min  09/16/2015  CLINICAL DATA:  Internal fixation of RIGHT femur fracture EXAM: DG C-ARM 61-120 MIN; RIGHT FEMUR 2 VIEWS COMPARISON:  09/15/2015 FINDINGS: Intra medullary nail fixation of the intertrochanteric fracture on the RIGHT. Single distal locking screw noted. Adjacent to the tip of the distal interlocking cortical screw, there is a  short approximately 5 mm fragment of cortical elevation. IMPRESSION: Intra medullary nail fixation of intertrochanteric RIGHT femur fracture Electronically Signed   By: Genevive BiStewart  Edmunds M.D.   On: 09/16/2015 14:38   Dg Hip Unilat With Pelvis 2-3 Views Right  09/15/2015  CLINICAL DATA:  81 year old female with history of trauma from a fall while walking complaining of right-sided hip pain. EXAM: DG HIP (WITH OR WITHOUT PELVIS) 2-3V RIGHT COMPARISON:  No priors. FINDINGS: No acute displaced fracture of the bony pelvic ring. There is a comminuted intertrochanteric fracture of the right femur with approximately 45 degrees of varus angulation, as well as some proximal migration of the fracture and lateral displacement. Right femoral head remains properly located. IMPRESSION: 1. Comminuted intertrochanteric fracture of the right femur with proximal migration and lateral displacement as well as approximately 45 degrees of varus angulation. Electronically Signed   By: Trudie Reedaniel  Entrikin M.D.   On: 09/15/2015 10:09   Dg Femur, Min 2 Views Right  09/16/2015  CLINICAL DATA:  Internal fixation of  RIGHT femur fracture EXAM: DG C-ARM 61-120 MIN; RIGHT FEMUR 2 VIEWS COMPARISON:  09/15/2015 FINDINGS: Intra medullary nail fixation of the intertrochanteric fracture on the RIGHT. Single distal locking screw noted. Adjacent to the tip of the distal interlocking cortical screw, there is a short approximately 5 mm fragment of cortical elevation. IMPRESSION: Intra medullary nail fixation of intertrochanteric RIGHT femur fracture Electronically Signed   By: Genevive BiStewart  Edmunds M.D.   On: 09/16/2015 14:38   Dg Femur Port, Min 2 Views Right  09/16/2015  CLINICAL DATA:  Intertrochanteric fracture of the proximal right femur. EXAM: RIGHT FEMUR PORTABLE 1 VIEW COMPARISON:  Radiographs dated 09/15/2015 FINDINGS: The patient has undergone open reduction and internal fixation of the comminuted intertrochanteric fracture. There is an intramedullary nail and lag screw across the comminuted fracture of the proximal femur. Alignment and position of the fracture fragments is much improved. Lesser trochanter fragment remains displaced. IMPRESSION: Open reduction and internal fixation of comminuted proximal femur fracture. Electronically Signed   By: Francene BoyersJames  Maxwell M.D.   On: 09/16/2015 15:45    Assessment/Plan: Day of Surgery   Active Problems:   Hip fracture, right (HCC)   Atrial fibrillation (HCC)   Hip fracture (HCC)   Pressure ulcer  Patient currently stable postop. Patient has underlying dementia. We will use narcotics judiciously. Recheck labs tomorrow AM. Begin physical therapy tomorrow. Foley catheter to be removed tomorrow. Patient will complete 24 hours postop antibiotics. I reviewed the postoperative x-rays which demonstrate the fractures reduced and the hardware is well positioned.    Juanell FairlyKRASINSKI, Uvaldo Rybacki , MD 09/16/2015, 4:29 PM

## 2015-09-16 NOTE — Consult Note (Signed)
ANTICOAGULATION CONSULT NOTE - Initial Consult  Pharmacy Consult for eliquis Indication: atrial fibrillation  No Known Allergies  Patient Measurements: Height: 5\' 5"  (165.1 cm) Weight: 148 lb 9.6 oz (67.405 kg) IBW/kg (Calculated) : 57 Heparin Dosing Weight:   Vital Signs: Temp: 98 F (36.7 C) (10/17 1637) Temp Source: Oral (10/17 1637) BP: 129/84 mmHg (10/17 1637) Pulse Rate: 63 (10/17 1637)  Labs:  Recent Labs  09/15/15 1036 09/15/15 1038 09/16/15 0258  HGB 12.2  --  12.0  HCT 36.6  --  36.0  PLT 140*  --  121*  APTT  --  31  --   LABPROT 23.8*  --  17.8*  INR 2.11  --  1.45  CREATININE 1.17*  --  1.14*    Estimated Creatinine Clearance: 32.5 mL/min (by C-G formula based on Cr of 1.14).   Medical History: Past Medical History  Diagnosis Date  . Hypertension   . A-fib (HCC)     Medications:  Scheduled:  . calcitonin (salmon)  1 spray Alternating Nares Daily  .  ceFAZolin (ANCEF) IV  2 g Intravenous Q6H  . citalopram  10 mg Oral Daily  . docusate sodium  100 mg Oral BID  . ferrous sulfate  325 mg Oral TID PC  . lisinopril  2.5 mg Oral [id number]  . metoprolol  150 mg Oral BID  . senna  1 tablet Oral BID  . warfarin  5 mg Oral ONCE-1800    Assessment: Pt is a 81 year old female post op hip surgery. Patient takes warfarin at home for stroke prevention due to afib. Pt was given IV vit K prior to surgery. This will present a challenge in returning pt INR to therapeutic levels quickly. Dr. Renae GlossWieting would like to switch pt over to eliquis. Discussed this with family and they are agreeable. Spoke with Dr. Martha ClanKrasinski, who agrees with switch and suggests waiting until 24 post op to start.   Monitor platelets by anticoagulation protocol: Yes   Plan:  Will start eliquis 5mg  BID tomorrow evening. Pharmacy to continue to monitor.  Hadiyah Maricle D Izzabella Besse 09/16/2015,4:55 PM

## 2015-09-16 NOTE — Consult Note (Signed)
WOC wound consult note Reason for Consult:Skin tear to left pretibial lower leg, present on admision Stage 2 pressure injury to sacrum, present on admission.   Wound type: Trauma to left lower leg Stage 2 pressure injury to sacrum Pressure Ulcer POA: Yes Measurement: Sacrum 2 cm x 1 cm x 0.1 Left anterior leg 2 cm x 1.6 cm x 0.1 skin flap edges are approximated at this time.  Wound ZOX:WRUEbed:LEft leg wound has hematoma present.  IS not fluctuant or able to be removed.   Drainage (amount, consistency, odor) LEft leg with scant serosanguinous drainage.  Periwound:Intact Dressing procedure/placement/frequency:Cleanse sacral ulcer with NS and apply silicone sacral dressing.  Chagne every 3 days and PRN soilage. Turn and reposition every 2 hours Cleanse left anterior leg with NS and pat gently dry.  Apply silicone border foam dressing.  Change every 3-5 days and PRN soilage.  Will not follow at this time.  Please re-consult if needed.  Maple HudsonKaren Sayuri Rhames RN BSN CWON Pager [phone]

## 2015-09-16 NOTE — Anesthesia Preprocedure Evaluation (Signed)
Anesthesia Evaluation   Patient unresponsive  General Assessment Comment:She has had several prior operations--a hysterectomy at age 81, an appendectomy and a hernia repair in 2014. No known problems. She is moderately demented and lives in a nursing home. Her daughter Earline MayotteDeborah Agresti has POA and signs her consents.  Reviewed: Allergy & Precautions, NPO status , Patient's Chart, lab work & pertinent test results  Airway Mallampati: II  TM Distance: >3 FB Neck ROM: Limited    Dental  (+) Teeth Intact   Pulmonary    breath sounds clear to auscultation       Cardiovascular Exercise Tolerance: Poor hypertension, Pt. on medications and Pt. on home beta blockers + dysrhythmias Atrial Fibrillation  Rhythm:Irregular Rate:Normal  Well controlled HTN on lisinopril and metoprolol, a-fib., on coumadin, held since yesterday and vit. K+ given, INR 1.45 today.   Neuro/Psych    GI/Hepatic negative GI ROS,   Endo/Other    Renal/GU      Musculoskeletal   Abdominal   Peds  Hematology   Anesthesia Other Findings   Reproductive/Obstetrics                             Anesthesia Physical Anesthesia Plan  ASA: IV and emergent  Anesthesia Plan: General   Post-op Pain Management:    Induction: Intravenous  Airway Management Planned: Oral ETT  Additional Equipment:   Intra-op Plan:   Post-operative Plan: Extubation in OR  Informed Consent:   Consent reviewed with POA  Plan Discussed with: CRNA  Anesthesia Plan Comments:         Anesthesia Quick Evaluation

## 2015-09-16 NOTE — Op Note (Signed)
DATE OF SURGERY:  09/16/2015  TIME: 3:13 PM  PATIENT NAME:  Lydia [name]  AGE: 81 y.o.  PRE-OPERATIVE DIAGNOSIS:  right intertrochanteric hip fracture  POST-OPERATIVE DIAGNOSIS:  SAME  PROCEDURE:  INTRAMEDULLARY (IM) NAIL FIXATION OF 4 PART INTERTROCHANTERIC HIP FRACTURE  SURGEON:  Juanell FairlyKRASINSKI, Lynden Flemmer  OPERATIVE IMPLANTS: Biomet long Affixus nail 11 x 400mm, 105 mm lag screw with a 50 mm distal interlocking screw  PREOPERATIVE INDICATIONS:  Lydia LootsBetty G Chrisman is a 81 y.o. year old who fell and suffered a hip fracture. She was brought into the ER and then admitted and medically cleared for surgical intervention.    The risks, benefits and alternatives were discussed with the patient's daughters.  They understood the risks include but are not limited to infection, bleeding, nerve or blood vessel injury, malunion, nonunion, hardware prominence, hardware failure, change in leg lengths or lower extremity rotation need for further surgery including hardware removal with conversion to a total hip arthroplasty. Medical risks include but are not limited to DVT and pulmonary embolism, myocardial infarction, stroke, pneumonia, respiratory failure and death. The patient's daughters understood these risks and wished to proceed with surgery.  OPERATIVE PROCEDURE:  The patient was brought to the operating room and placed in the supine position on the fracture table. General endotranchial anesthesia was administered.  The left leg was placed in a hemi-lithotomy position. The right leg was placed in a leg holder to allow for traction.  A closed reduction was performed under C-arm guidance longitudinal traction and internal rotation.  The fracture reduction was confirmed on both AP and lateral views.  A time out was performed to verify the patient's name, date of birth, medical record number, correct site of surgery correct procedure to be performed. The timeout was also used to verify the patient received  antibiotics and all appropriate instruments, implants and radiographic studies were available in the room. Once all in attendance were in agreement, the case began. The patient was prepped and draped in a sterile fashion. She received preoperative antibiotics with 2 grams of Ancef.  An incision was made proximal to the greater trochanter in line with the femur. A guidewire was placed over the tip of the greater trochanter and advanced into the proximal femur to the level of the lesser trochanter.  Confirmation of the drill pin position was made on AP and lateral C-arm images.  The threaded guidepin was then overdrilled with the proximal femoral drill.  A long ball-tipped guidewire was then advanced through the drill hole and into the femur. It was advanced across the fracture site and into the distal femur to the level of the knee.  The length of the guidewire was measured with a depth gauge to 400 mm. Sequential cannulated, flexible reamers were then passed over the guidewire to a diameter of 13 mm.  This allowed for passage of a 11 x 400 mm long Affixus nail across the fracture site and down the femoral shaft. Its position was confirmed on AP and lateral C-arm images.   Once the nail was completely seated, the drill guide for the lag screw was placed through the guide arm for the Affixus nail. A guidepin was then placed through this drill guide and advanced through the lateral cortex of the femur, across the fracture site and into the femoral head achieving a tip apex distance of less than 25 mm. The length of the drill pin was measured, and then the drill for the lag screw was advanced through  the lateral cortex, across the fracture site and up into the femoral head to the depth of the lag screw..  The lag screw was then advanced by hand into position across the fracture site into the femoral head. Its final position was confirmed on AP and lateral C-arm images. Compression was applied as traction was  carefully released. The set screw in the top of the intramedullary rod was tightened by hand using a screwdriver. It was backed off a quarter turn to allow for compression at the fracture site.  The attention was then turned to the basement of the distal interlocking screw. This was done performed using a perfect circle technique. A small stab incision was made over the oblong screw hole. The distal interlocking screw hole was drilled using a freehand technique. His measured to be 50 mm in diameter. The screw is then advanced into position by hand. Final AP and lateral images of the distal femur confirming placement distal interlocking screw performed.  The wounds were irrigated copiously and closed with 0 Vicryl for closure of the deep fascia and 2-0 Vicryl for subcutaneous closure. The skin was approximated with staples. A dry sterile dressing was applied. I was scrubbed and present the entire case and all sharp and instrument counts were correct at the conclusion of the case. Patient was transferred to hospital bed and brought to PACU in stable condition. I spoke with the patient's family in the postop consultation room to let them know the case had been performed without complication and the patient was stable in the recovery room.   Kathreen Devoid, MD

## 2015-09-16 NOTE — Anesthesia Procedure Notes (Signed)
Procedure Name: Intubation Date/Time: 09/16/2015 12:51 PM Performed by: Michaele OfferSAVAGE, Ennifer Harston Pre-anesthesia Checklist: Patient identified, Emergency Drugs available, Suction available, Patient being monitored and Timeout performed Patient Re-evaluated:Patient Re-evaluated prior to inductionOxygen Delivery Method: Circle system utilized Preoxygenation: Pre-oxygenation with 100% oxygen Intubation Type: IV induction Ventilation: Mask ventilation without difficulty Laryngoscope Size: Mac and 3 Grade View: Grade I Tube type: Oral Tube size: 7.0 mm Number of attempts: 1 Airway Equipment and Method: Rigid stylet Placement Confirmation: ETT inserted through vocal cords under direct vision,  positive ETCO2 and breath sounds checked- equal and bilateral Secured at: 19 cm Tube secured with: Tape Dental Injury: Teeth and Oropharynx as per pre-operative assessment

## 2015-09-17 LAB — CBC
HEMATOCRIT: 33.2 % — AB (ref 35.0–47.0)
Hemoglobin: 11.2 g/dL — ABNORMAL LOW (ref 12.0–16.0)
MCH: 31.6 pg (ref 26.0–34.0)
MCHC: 33.7 g/dL (ref 32.0–36.0)
MCV: 93.9 fL (ref 80.0–100.0)
Platelets: 115 10*3/uL — ABNORMAL LOW (ref 150–440)
RBC: 3.54 MIL/uL — ABNORMAL LOW (ref 3.80–5.20)
RDW: 15.4 % — AB (ref 11.5–14.5)
WBC: 9.3 10*3/uL (ref 3.6–11.0)

## 2015-09-17 LAB — BASIC METABOLIC PANEL
Anion gap: 4 — ABNORMAL LOW (ref 5–15)
BUN: 32 mg/dL — ABNORMAL HIGH (ref 6–20)
CALCIUM: 8.7 mg/dL — AB (ref 8.9–10.3)
CO2: 28 mmol/L (ref 22–32)
CREATININE: 1.07 mg/dL — AB (ref 0.44–1.00)
Chloride: 112 mmol/L — ABNORMAL HIGH (ref 101–111)
GFR calc non Af Amer: 46 mL/min — ABNORMAL LOW (ref >60)
GFR, EST AFRICAN AMERICAN: 53 mL/min — AB (ref >60)
Glucose, Bld: 141 mg/dL — ABNORMAL HIGH (ref 65–99)
Potassium: 4.3 mmol/L (ref 3.5–5.1)
SODIUM: 144 mmol/L (ref 135–145)

## 2015-09-17 MED ORDER — FUROSEMIDE 40 MG PO TABS
40.0000 mg | ORAL_TABLET | Freq: Every day | ORAL | Status: DC
  Administered 2015-09-17 – 2015-09-19 (×3): 40 mg via ORAL
  Filled 2015-09-17 (×4): qty 1

## 2015-09-17 NOTE — Progress Notes (Signed)
Subjective:  Patient is resting comfortably in bed. She is postop day #1 status post intramedullary fixation for right intertrochanteric hip fracture.  Patient reports pain as mild.  Patient is drowsy but arousable and will follow commands.  Objective:   VITALS:   Filed Vitals:   09/16/15 2028 09/16/15 2041 09/17/15 0421 09/17/15 0809  BP: 122/79 134/87 128/93 111/76  Pulse: 91 93 102 62  Temp:  98.5 F (36.9 C) 97.8 F (36.6 C) 98.7 F (37.1 C)  TempSrc:  Oral Oral Oral  Resp:  Height:      Weight:      SpO2:  91% 98% 99%    PHYSICAL EXAM:  Right lower extremity/hip: Patient's dressing remains clean dry and intact. She has mild swelling to the right thigh but no erythema. There is no drainage from the incisions. The thigh and leg compartments are soft and compressible. She has no calf tenderness or lower leg edema. She is wearing TED stockings and foot pumps. She has palpable pedal pulses and can flex and extend her toes. Patient has intact sensation light touch.   LABS  Results for orders placed or performed during the hospital encounter of 09/15/15 (from the past 24 hour(s))  CBC     Status: Abnormal   Collection Time: 09/17/15  6:06 AM  Result Value Ref Range   WBC 9.3 3.6 - 11.0 K/uL   RBC 3.54 (L) 3.80 - 5.20 MIL/uL   Hemoglobin 11.2 (L) 12.0 - 16.0 g/dL   HCT 16.1 (L) 09.6 - 04.5 %   MCV 93.9 80.0 - 100.0 fL   MCH 31.6 26.0 - 34.0 pg   MCHC 33.7 32.0 - 36.0 g/dL   RDW 40.9 (H) 81.1 - 91.4 %   Platelets 115 (L) 150 - 440 K/uL  Basic metabolic panel     Status: Abnormal   Collection Time: 09/17/15  6:06 AM  Result Value Ref Range   Sodium 144 135 - 145 mmol/L   Potassium 4.3 3.5 - 5.1 mmol/L   Chloride 112 (H) 101 - 111 mmol/L   CO2 28 22 - 32 mmol/L   Glucose, Bld 141 (H) 65 - 99 mg/dL   BUN 32 (H) 6 - 20 mg/dL   Creatinine, Ser 7.82 (H) 0.44 - 1.00 mg/dL   Calcium 8.7 (L) 8.9 - 10.3 mg/dL   GFR calc non Af Amer 46 (L) >60 mL/min   GFR calc Af  Amer 53 (L) >60 mL/min   Anion gap 4 (L) 5 - 15    Dg C-arm 61-120 Min  09/16/2015  CLINICAL DATA:  Internal fixation of RIGHT femur fracture EXAM: DG C-ARM 61-120 MIN; RIGHT FEMUR 2 VIEWS COMPARISON:  09/15/2015 FINDINGS: Intra medullary nail fixation of the intertrochanteric fracture on the RIGHT. Single distal locking screw noted. Adjacent to the tip of the distal interlocking cortical screw, there is a short approximately 5 mm fragment of cortical elevation. IMPRESSION: Intra medullary nail fixation of intertrochanteric RIGHT femur fracture Electronically Signed   By: Genevive Bi M.D.   On: 09/16/2015 14:38   Dg Femur, Min 2 Views Right  09/16/2015  CLINICAL DATA:  Internal fixation of RIGHT femur fracture EXAM: DG C-ARM 61-120 MIN; RIGHT FEMUR 2 VIEWS COMPARISON:  09/15/2015 FINDINGS: Intra medullary nail fixation of the intertrochanteric fracture on the RIGHT. Single distal locking screw noted. Adjacent to the tip of the distal interlocking cortical screw, there is a short approximately 5 mm fragment of cortical elevation. IMPRESSION:  Intra medullary nail fixation of intertrochanteric RIGHT femur fracture Electronically Signed   By: Genevive BiStewart  Edmunds M.D.   On: 09/16/2015 14:38   Dg Femur Port, Min 2 Views Right  09/16/2015  CLINICAL DATA:  Intertrochanteric fracture of the proximal right femur. EXAM: RIGHT FEMUR PORTABLE 1 VIEW COMPARISON:  Radiographs dated 09/15/2015 FINDINGS: The patient has undergone open reduction and internal fixation of the comminuted intertrochanteric fracture. There is an intramedullary nail and lag screw across the comminuted fracture of the proximal femur. Alignment and position of the fracture fragments is much improved. Lesser trochanter fragment remains displaced. IMPRESSION: Open reduction and internal fixation of comminuted proximal femur fracture. Electronically Signed   By: Francene BoyersJames  Maxwell M.D.   On: 09/16/2015 15:45    Assessment/Plan: 1 Day Post-Op    Active Problems:   Hip fracture, right (HCC)   Atrial fibrillation (HCC)   Hip fracture (HCC)   Pressure ulcer  Patient is stable postop. She is receiving only Tylenol today for pain. She is not requiring narcotics for pain control. I wouldn't recommend continuing judicious use of narcotics to avoid sedation. Continue physical therapy. Patient will require monitoring until at least postop day #3 before she should be considered for discharge to skilled nursing facility. Encourage incentive spirometry while awake. Recheck CBC and BMP tomorrow AM.    Juanell FairlyKRASINSKI, Deandrae Wajda , MD 09/17/2015, 12:58 PM

## 2015-09-17 NOTE — Progress Notes (Signed)
Patient did not work with PT this morning. Clinical Education officer, museum (CSW) met with patient's daughter Lydia [name] at bedside. [facility] will make a bed offer pending PT evaluation. CSW presented current bed offers. Daughter reported that she prefers [facility] and wants to see how patient does with PT this afternoon. CSW discussed long term care options with private pay. CSW also gave daughter [name] phone number to help with Medicaid. Per daughter patient is paying privately at [facility] and is not sure if she will qualify for Medicaid. CSW will continue to follow and assist as needed.   Blima Rich, [place] [phone]

## 2015-09-17 NOTE — Progress Notes (Signed)
Patient ID: Lydia LootsBetty G Berndt, female   DOB: 01/05/1928, 81 y.o.   MRN: [id number] [facility] PROGRESS NOTE  PCP: Clydie BraunFITZGERALD, DAVID, MD  HPI/Subjective: Patient is a poor historian. Says she feels terrible. Unable to elaborate. Some pain in the hip with moving around.  Objective: Filed Vitals:   09/17/15 0809  BP: 111/76  Pulse: 62  Temp: 98.7 F (37.1 C)  Resp: 18    Filed Weights   09/15/15 0913 09/15/15 1200 09/16/15 0552  Weight: 66.543 kg (146 lb 11.2 oz) 66.996 kg (147 lb 11.2 oz) 67.405 kg (148 lb 9.6 oz)    ROS: Review of Systems  Constitutional: Negative for fever and chills.  Eyes: Negative for blurred vision.  Respiratory: Negative for cough and shortness of breath.   Cardiovascular: Negative for chest pain.  Gastrointestinal: Negative for nausea, vomiting and abdominal pain.  Genitourinary: Negative for dysuria.  Musculoskeletal: Positive for joint pain.  Neurological: Negative for dizziness and headaches.  Endo/Heme/Allergies: Bruises/bleeds easily.   Exam: Physical Exam  HENT:  Nose: No mucosal edema.  Mouth/Throat: No oropharyngeal exudate or posterior oropharyngeal edema.  Eyes: Conjunctivae and lids are normal. Pupils are equal, round, and reactive to light.  Neck: No JVD present. Carotid bruit is not present. No edema present. No thyroid mass and no thyromegaly present.  Cardiovascular: S1 normal and S2 normal.  An irregularly irregular rhythm present. Exam reveals no gallop.   Murmur heard.  Systolic murmur is present with a grade of 2/6  Pulses:      Dorsalis pedis pulses are 2+ on the right side, and 2+ on the left side.  Respiratory: No respiratory distress. She has no wheezes. She has no rhonchi. She has no rales.  GI: Soft. Bowel sounds are normal. There is no tenderness.  Musculoskeletal:       Right ankle: She exhibits no swelling.       Left ankle: She exhibits no swelling.  Lymphadenopathy:    She has no cervical adenopathy.   Neurological: She is alert. No cranial nerve deficit.  Intact sensation bilateral lower extremities. Patient able to wiggle toes bilaterally  Skin: Skin is warm. Nails show no clubbing.  Nursing staff documenting a stage I decubitus ulcer on buttock. No skin breakdown but discoloration of skin. Patient also has bruising upper right chest and neck.  Psychiatric: She has a normal mood and affect.    Data Reviewed: Basic Metabolic Panel:  Recent Labs Lab 09/15/15 1036 09/16/15 0258 09/17/15 0606  NA 140 143 144  K 4.5 4.8 4.3  CL 108 109 112*  CO2 29 26 28   GLUCOSE 125* 145* 141*  BUN 31* 32* 32*  CREATININE 1.17* 1.14* 1.07*  CALCIUM 8.9 9.1 8.7*   Liver Function Tests:  Recent Labs Lab 09/15/15 1038  ALBUMIN 3.6   CBC:  Recent Labs Lab 09/15/15 1036 09/16/15 0258 09/17/15 0606  WBC 10.0 8.2 9.3  NEUTROABS 8.6* 6.0  --   HGB 12.2 12.0 11.2*  HCT 36.6 36.0 33.2*  MCV 93.3 93.9 93.9  PLT 140* 121* 115*     Recent Results (from the past 240 hour(s))  Urine culture     Status: None   Collection Time: 09/15/15 10:37 AM  Result Value Ref Range Status   Specimen Description URINE, RANDOM  Final   Special Requests NONE  Final   Culture NO GROWTH 1 DAY  Final   Report Status 09/16/2015 FINAL  Final  Surgical pcr screen  Status: None   Collection Time: 09/15/15  3:23 PM  Result Value Ref Range Status   MRSA, PCR NEGATIVE NEGATIVE Final   Staphylococcus aureus NEGATIVE NEGATIVE Final    Comment:        The Xpert SA Assay (FDA approved for NASAL specimens in patients over 18 years of age), is one component of a comprehensive surveillance program.  Test performance has been validated by [facility] for patients greater than or equal to 64 year old. It is not intended to diagnose infection nor to guide or monitor treatment.      Studies: Dg Chest 1 View  09/15/2015  CLINICAL DATA:  Right hip fracture.  Preop respiratory exam. EXAM: CHEST 1 VIEW  COMPARISON:  02/19/2015 FINDINGS: Mild to moderate cardiomegaly stable. No evidence of pulmonary edema or other infiltrate. No evidence of pleural effusion. IMPRESSION: Cardiomegaly.  No active lung disease. Electronically Signed   By: Myles Rosenthal M.D.   On: 09/15/2015 10:11   Dg C-arm 61-120 Min  09/16/2015  CLINICAL DATA:  Internal fixation of RIGHT femur fracture EXAM: DG C-ARM 61-120 MIN; RIGHT FEMUR 2 VIEWS COMPARISON:  09/15/2015 FINDINGS: Intra medullary nail fixation of the intertrochanteric fracture on the RIGHT. Single distal locking screw noted. Adjacent to the tip of the distal interlocking cortical screw, there is a short approximately 5 mm fragment of cortical elevation. IMPRESSION: Intra medullary nail fixation of intertrochanteric RIGHT femur fracture Electronically Signed   By: Genevive Bi M.D.   On: 09/16/2015 14:38   Dg Hip Unilat With Pelvis 2-3 Views Right  09/15/2015  CLINICAL DATA:  81 year old female with history of trauma from a fall while walking complaining of right-sided hip pain. EXAM: DG HIP (WITH OR WITHOUT PELVIS) 2-3V RIGHT COMPARISON:  No priors. FINDINGS: No acute displaced fracture of the bony pelvic ring. There is a comminuted intertrochanteric fracture of the right femur with approximately 45 degrees of varus angulation, as well as some proximal migration of the fracture and lateral displacement. Right femoral head remains properly located. IMPRESSION: 1. Comminuted intertrochanteric fracture of the right femur with proximal migration and lateral displacement as well as approximately 45 degrees of varus angulation. Electronically Signed   By: Trudie Reed M.D.   On: 09/15/2015 10:09   Dg Femur, Min 2 Views Right  09/16/2015  CLINICAL DATA:  Internal fixation of RIGHT femur fracture EXAM: DG C-ARM 61-120 MIN; RIGHT FEMUR 2 VIEWS COMPARISON:  09/15/2015 FINDINGS: Intra medullary nail fixation of the intertrochanteric fracture on the RIGHT. Single distal  locking screw noted. Adjacent to the tip of the distal interlocking cortical screw, there is a short approximately 5 mm fragment of cortical elevation. IMPRESSION: Intra medullary nail fixation of intertrochanteric RIGHT femur fracture Electronically Signed   By: Genevive Bi M.D.   On: 09/16/2015 14:38   Dg Femur Port, Min 2 Views Right  09/16/2015  CLINICAL DATA:  Intertrochanteric fracture of the proximal right femur. EXAM: RIGHT FEMUR PORTABLE 1 VIEW COMPARISON:  Radiographs dated 09/15/2015 FINDINGS: The patient has undergone open reduction and internal fixation of the comminuted intertrochanteric fracture. There is an intramedullary nail and lag screw across the comminuted fracture of the proximal femur. Alignment and position of the fracture fragments is much improved. Lesser trochanter fragment remains displaced. IMPRESSION: Open reduction and internal fixation of comminuted proximal femur fracture. Electronically Signed   By: Francene Boyers M.D.   On: 09/16/2015 15:45    Scheduled Meds: . apixaban  5 mg Oral BID  .  calcitonin (salmon)  1 spray Alternating Nares Daily  . citalopram  10 mg Oral Daily  . docusate sodium  100 mg Oral BID  . ferrous sulfate  325 mg Oral TID PC  . lisinopril  2.5 mg Oral BH-[id number]  . metoprolol  150 mg Oral BID  . senna  1 tablet Oral BID  . Warfarin - Physician Dosing Inpatient   Does not apply q1800    Assessment/Plan: 1. Atrial fibrillation. Continue metoprolol 150 mg twice a day for rate control. Coumadin reversed with IV vitamin K. Eliquis to be started today. 2. Right hip fracture closed requiring operative repair. Physical therapy evaluation. Likely to rehabilitation on postoperative day 3. 3. History of congestive heart failure- currently no signs of congestive heart failure but careful with fluids intraoperatively and postoperatively. DC IV fluids 4. Essential hypertension- blood pressure stable 5. Respiratory status stable continue inhalers 6.  History of depression on Celexa 7. Patient in the memory unit at [facility] ([place] 8. Stage I pressure ulcer on buttock as per nursing staff. Dressings applied.   Code Status:     Code Status Orders        Start     Ordered   09/15/15 1233  Do not attempt resuscitation (DNR)   Continuous    Question Answer Comment  In the event of cardiac or respiratory ARREST Do not call a "code blue"   In the event of cardiac or respiratory ARREST Do not perform Intubation, CPR, defibrillation or ACLS   In the event of cardiac or respiratory ARREST Use medication by any route, position, wound care, and other measures to relive pain and suffering. May use oxygen, suction and manual treatment of airway obstruction as needed for comfort.      09/15/15 1232    Advance Directive Documentation        Most Recent Value   Type of Advance Directive  Out of facility DNR (pink MOST or yellow form)   Pre-existing out of facility DNR order (yellow form or pink MOST form)     "MOST" Form in Place?       Family Communication: Family at bedside Disposition Plan: Potentially to rehabilitation postoperatively day 3.  Consultants:  Orthopedic surgery  Time spent: 20 minutes  Alford Highland  [facility]

## 2015-09-17 NOTE — Progress Notes (Signed)
PT Cancellation Note  Patient Details Name: Lydia [name] MRN: [id number] DOB: 05/23/1928   Cancelled Treatment:    Reason Eval/Treat Not Completed: Fatigue/lethargy limiting ability to participate (pt unable to stay awake/ unable to actively participate. Will re-attempt evaluation later today. )   [name],SPT 09/17/2015, 9:04 AM

## 2015-09-17 NOTE — Evaluation (Signed)
Occupational Therapy Evaluation Patient Details Name: Lydia [name] MRN: [id number] DOB: 06/04/1928 Today's Date: 09/17/2015    History of Present Illness Lydia [name] is a 81 y.o. female who complains of right hip pain status post fall. Patient has dementia and is unable to provide accurate history regarding the mechanism of injury. Her daughters are at the bedside. They report the patient is walking without her walker which she uses normally at baseline for assistance with ambulation. Patient has a history of atrial fibrillation and is on Coumadin at baseline. She is postop day #1 status post intramedullary fixation for right intertrochanteric hip fracture.   Clinical Impression   Pt presents with lethargy which limits her participation in ADLs and hx of dementia per her daughter.  She currently is dependent for all self care skills and would benefit from skilled OT services to increase participation in ADLs, improve functional mobility and educate family on how to assist pt and encourage her to as much for herself as possible.  Will assess if patient will be able to use adaptive equipment for self care or not since she has dementia.  If this is not possible, therapy sessions will focus on family training.    Follow Up Recommendations  SNF    Equipment Recommendations       Recommendations for Other Services       Precautions / Restrictions Precautions Precautions: Fall Restrictions Weight Bearing Restrictions: Yes RLE Weight Bearing: Weight bearing as tolerated      Mobility Bed Mobility Overal bed mobility: Needs Assistance Bed Mobility: Supine to Sit     Supine to sit: Max assist     General bed mobility comments: pt requires max assist +1 for transfer from supine <>sit   Transfers Overall transfer level:  (pt unable to maintain sitting balance and no postural righting reflexes noted; not safe to perform standing.)                    Balance Overall  balance assessment: Needs assistance Sitting-balance support: Bilateral upper extremity supported Sitting balance-Leahy Scale: Poor Sitting balance - Comments: pt required mod assist +1 in order to maintain sitting balance Postural control: Posterior lean;Other (comment) (pt with significant kyphosis and foward head while seated) Standing balance support:  (not performed)                                ADL                                         General ADL Comments: Pt is currently dependent for all ADLs due to lethargy which her daughter states has been an issue since surgery.     Vision     Perception     Praxis      Pertinent Vitals/Pain Pain Assessment: 0-10 Pain Location: pt stated that her R hip hurt but not able to rate it Pain Intervention(s): Limited activity within patient's tolerance;Monitored during session     Hand Dominance Right   Extremity/Trunk Assessment Upper Extremity Assessment Upper Extremity Assessment: Overall WFL for tasks assessed   Lower Extremity Assessment Lower Extremity Assessment: Defer to PT evaluation   Cervical / Trunk Assessment Cervical / Trunk Assessment: Kyphotic (neck is held in constant R cervical side bend and flexion)   Communication Communication Communication: Expressive  difficulties   Cognition Arousal/Alertness: Lethargic Behavior During Therapy: WFL for tasks assessed/performed Overall Cognitive Status: History of cognitive impairments - at baseline       Memory: Decreased recall of precautions;Decreased short-term memory             General Comments       Exercises   Other Exercises Other Exercises: sittting balance x 5 minutes with BUE support and PT support min-mod assist with intermittent periods of no assist in order to increase pt's core strength and sitting balance independence   Shoulder Instructions      Home Living Family/patient expects to be discharged to::  Skilled nursing facility                                 Additional Comments: [facility]      Prior Functioning/Environment Level of Independence: Needs assistance  Gait / Transfers Assistance Needed: Accoring to daughter pt was able to get around using her RW prior to fall and wore 3 pairs of pants and shirts because she could not remember if she put any on and had a lot of dementia symptoms ADL's / Homemaking Assistance Needed: Required assistance Communication / Swallowing Assistance Needed: Dementia; pt is poor historian      OT Diagnosis: Generalized weakness;Acute pain   OT Problem List: Decreased strength;Decreased range of motion;Decreased activity tolerance;Pain   OT Treatment/Interventions: Self-care/ADL training;Therapeutic exercise;DME and/or AE instruction    OT Goals(Current goals can be found in the care plan section) Acute Rehab OT Goals Patient Stated Goal: to go to SNF OT Goal Formulation: With patient Time For Goal Achievement: 10/01/15 Potential to Achieve Goals: Fair  OT Frequency: Min 1X/week   Barriers to D/C:            Co-evaluation              End of Session    Activity Tolerance: Patient limited by lethargy;Patient limited by pain Patient left: in bed;with call bell/phone within reach;with bed alarm set;with family/visitor present   Time: 1530-1555 OT Time Calculation (min): 25 min Charges:  OT General Charges $OT Visit: 1 Procedure OT Evaluation $Initial OT Evaluation Tier I: 1 Procedure OT Treatments $Self Care/Home Management : 8-22 mins G-Codes:    Wofford,Susan 09/17/2015, 4:08 PM    Susanne BordersSusan Wofford, OTR/L ascom [phone]

## 2015-09-17 NOTE — Evaluation (Signed)
Physical Therapy Evaluation Patient Details Name: MAKENDRA VIGEANT MRN: [id number] DOB: [birth year] Today's Date: 09/17/2015   History of Present Illness  ZULEIKA GALLUS is a 81 y.o. female who complains of right hip pain status post fall. Patient has dementia and is unable to provide accurate history regarding the mechanism of injury. Her daughters are at the bedside. They report the patient is walking without her walker which she uses normally at baseline for assistance with ambulation. Patient has a history of atrial fibrillation and is on Coumadin at baseline. She is postop day #1 status post intramedullary fixation for right intertrochanteric hip fracture.  Clinical Impression  Pt less drowsy this afternoon; still would occasionally fall asleep while being given instructions when in supine. Pt requires significant vc and demonstration in order for her to comprehend desired task to be performed. Pt has limited ability to actively move RLE at the hip/knee due to pain and weakness. Pt is max assist +1 for transfer from supine<>seated on EOB. Requires min-mod assist from PT and BUE support in order to maintain sitting balance. Pt is significantly kyphotic and keeps cervical spine laterally flexed to the R at all times. Tends to lose balance posteriorly if support taken away. Due to patient's inability to maintain sitting balance and lack of righting responses pt is unsafe to stand at this time. Will attempt to progress functional mobility next session. Pt will benefit from acute skilled PT services in order to increase functional mobility and strength.     Follow Up Recommendations SNF    Equipment Recommendations   (pt already owns RW)    Recommendations for Other Services       Precautions / Restrictions Precautions Precautions: Fall Restrictions Weight Bearing Restrictions: Yes RLE Weight Bearing: Weight bearing as tolerated      Mobility  Bed Mobility Overal bed mobility: Needs  Assistance Bed Mobility: Supine to Sit     Supine to sit: Max assist     General bed mobility comments: pt requires max assist +1 for transfer from supine <>sit   Transfers Overall transfer level:  (pt unable to maintain sitting balance and no postural righting reflexes noted; not safe to perform standing.)                  Ambulation/Gait Ambulation/Gait assistance:  (see above)              Stairs            Wheelchair Mobility    Modified Rankin (Stroke Patients Only)       Balance Overall balance assessment: Needs assistance Sitting-balance support: Bilateral upper extremity supported Sitting balance-Leahy Scale: Poor Sitting balance - Comments: pt required mod assist +1 in order to maintain sitting balance Postural control: Posterior lean;Other (comment) (pt with significant kyphosis and foward head while seated) Standing balance support:  (not performed)                                 Pertinent Vitals/Pain Pain Assessment: 0-10 Pain Location: pt states that her R hip hurts but did not place a number on it Pain Intervention(s): Limited activity within patient's tolerance;Monitored during session;Repositioned;Ice applied    Home Living Family/patient expects to be discharged to:: Skilled nursing facility                 Additional Comments: Pt was living at [facility] memory care facility prior to hospitalization  Prior Function Level of Independence: Needs assistance   Gait / Transfers Assistance Needed: Accoring to daughter pt was able to get around using her RW prior to fall  ADL's / Homemaking Assistance Needed: Required assistance        Hand Dominance        Extremity/Trunk Assessment   Upper Extremity Assessment: Overall WFL for tasks assessed           Lower Extremity Assessment: Generalized weakness (pt BLE strength 2+/5; limited ability to perform AROM)      Cervical / Trunk Assessment:  Kyphotic (neck is held in constant R cervical side bend and flexion)  Communication   Communication: Expressive difficulties  Cognition Arousal/Alertness: Lethargic Behavior During Therapy: WFL for tasks assessed/performed Overall Cognitive Status: History of cognitive impairments - at baseline                      General Comments      Exercises Total Joint Exercises Ankle Circles/Pumps: AROM;10 reps;Supine Heel Slides: AROM;10 reps;Right;Supine (limited range) Hip ABduction/ADduction: AAROM;10 reps;Right;Supine Other Exercises Other Exercises: sittting balance x 5 minutes with BUE support and PT support min-mod assist with intermittent periods of no assist in order to increase pt's core strength and sitting balance independence      Assessment/Plan    PT Assessment Patient needs continued PT services  PT Diagnosis Generalized weakness;Acute pain;Difficulty walking   PT Problem List Decreased strength;Decreased activity tolerance;Decreased balance;Decreased mobility;Decreased cognition;Pain  PT Treatment Interventions DME instruction;Gait training;Functional mobility training;Therapeutic activities;Therapeutic exercise;Balance training;Patient/family education   PT Goals (Current goals can be found in the Care Plan section) Acute Rehab PT Goals Patient Stated Goal: to go to SNF PT Goal Formulation: With patient/family Time For Goal Achievement: 10/01/15 Potential to Achieve Goals: Good    Frequency BID   Barriers to discharge        Co-evaluation               End of Session   Activity Tolerance: Patient limited by pain Patient left: in bed;with call bell/phone within reach;with bed alarm set;with SCD's reapplied;with family/visitor present           Time: 1334-1350 PT Time Calculation (min) (ACUTE ONLY): 16 min   Charges:         PT G CodesGeorgina Peer:        Ladasia Sircy,SPT 09/17/2015, 3:12 PM

## 2015-09-17 NOTE — Plan of Care (Signed)
Problem: Phase II Progression Outcomes Goal: CMS/Neurovascular status WDL Outcome: Progressing Neuro checks wdl  Problem: Phase III Progression Outcomes Goal: Pain controlled on oral analgesia Outcome: Completed/Met Date Met:  09/17/15 NO complaints of pain this shift stating that hip feels better. Goal: Incision clean - minimal/no drainage Outcome: Progressing Minimal drainage to dressing

## 2015-09-17 NOTE — Progress Notes (Signed)
Patient got to the beside with PT this afternoon. Clinical Social Worker (CSW) met with patient's Daughter Neoma Laming and let her know [facility] made bed offer. Daughter chose [facility] Kim admissions coordinator at [facility] is aware of accepted offer. Plan is for patient to D/C to [facility] Thursday 09/19/15. CSW will continue to follow and assist as needed.   Blima Rich, [place] [phone]

## 2015-09-18 ENCOUNTER — Encounter
Admission: RE | Admit: 2015-09-18 | Discharge: 2015-09-18 | Disposition: A | Discharge status: Home / Self Care | Payer: Medicare Other | Source: Ambulatory Visit | Attending: Internal Medicine | Admitting: Internal Medicine

## 2015-09-18 DIAGNOSIS — R350 Frequency of micturition: Secondary | ICD-10-CM | POA: Insufficient documentation

## 2015-09-18 LAB — CBC
HCT: 33.8 % — ABNORMAL LOW (ref 35.0–47.0)
Hemoglobin: 11.1 g/dL — ABNORMAL LOW (ref 12.0–16.0)
MCH: 30.9 pg (ref 26.0–34.0)
MCHC: 32.8 g/dL (ref 32.0–36.0)
MCV: 94.2 fL (ref 80.0–100.0)
PLATELETS: 121 10*3/uL — AB (ref 150–440)
RBC: 3.59 MIL/uL — ABNORMAL LOW (ref 3.80–5.20)
RDW: 14.8 % — ABNORMAL HIGH (ref 11.5–14.5)
WBC: 10.4 10*3/uL (ref 3.6–11.0)

## 2015-09-18 LAB — BASIC METABOLIC PANEL
Anion gap: 5 (ref 5–15)
BUN: 36 mg/dL — AB (ref 6–20)
CHLORIDE: 111 mmol/L (ref 101–111)
CO2: 29 mmol/L (ref 22–32)
CREATININE: 1.07 mg/dL — AB (ref 0.44–1.00)
Calcium: 8.7 mg/dL — ABNORMAL LOW (ref 8.9–10.3)
GFR calc Af Amer: 53 mL/min — ABNORMAL LOW (ref >60)
GFR, EST NON AFRICAN AMERICAN: 46 mL/min — AB (ref >60)
GLUCOSE: 125 mg/dL — AB (ref 65–99)
Potassium: 4 mmol/L (ref 3.5–5.1)
SODIUM: 145 mmol/L (ref 135–145)

## 2015-09-18 MED ORDER — BETHANECHOL CHLORIDE 10 MG PO TABS
10.0000 mg | ORAL_TABLET | Freq: Three times a day (TID) | ORAL | Status: DC
  Administered 2015-09-18 – 2015-09-19 (×3): 10 mg via ORAL
  Filled 2015-09-18 (×5): qty 1

## 2015-09-18 MED ORDER — METOPROLOL TARTRATE 100 MG PO TABS: 100.0000 mg | ORAL_TABLET | Freq: Two times a day (BID) | ORAL | Status: DC

## 2015-09-18 MED ORDER — BETHANECHOL CHLORIDE 10 MG PO TABS
10.0000 mg | ORAL_TABLET | Freq: Three times a day (TID) | ORAL | Status: DC
  Administered 2015-09-18: 10 mg via ORAL
  Filled 2015-09-18 (×2): qty 1

## 2015-09-18 NOTE — Progress Notes (Addendum)
Patient ID: Lydia [name], female   DOB: 05/28/1928, 81 y.o.   MRN: [id number] [facility] PROGRESS NOTE  PCP: Clydie BraunFITZGERALD, DAVID, MD  HPI/Subjective: As per daughter, patient is still a little sleepy. She is taking Tylenol for pain. Complains of pain in the chest where she has bruising. Did not mention any hip pain to me.  Objective: Filed Vitals:   09/18/15 0800  BP: 114/78  Pulse: 109  Temp: 98.3 F (36.8 C)  Resp: 16    Filed Weights   09/15/15 0913 09/15/15 1200 09/16/15 0552  Weight: 66.543 kg (146 lb 11.2 oz) 66.996 kg (147 lb 11.2 oz) 67.405 kg (148 lb 9.6 oz)    ROS: Review of Systems  Constitutional: Negative for fever and chills.  Eyes: Negative for blurred vision.  Respiratory: Negative for cough and shortness of breath.   Cardiovascular: Negative for chest pain.  Gastrointestinal: Negative for nausea, vomiting and abdominal pain.  Genitourinary: Negative for dysuria.  Musculoskeletal: Positive for joint pain.  Neurological: Negative for dizziness and headaches.  Endo/Heme/Allergies: Bruises/bleeds easily.   Exam: Physical Exam  HENT:  Nose: No mucosal edema.  Mouth/Throat: No oropharyngeal exudate or posterior oropharyngeal edema.  Eyes: Conjunctivae and lids are normal. Pupils are equal, round, and reactive to light.  Neck: No JVD present. Carotid bruit is not present. No edema present. No thyroid mass and no thyromegaly present.  Cardiovascular: S1 normal and S2 normal.  An irregularly irregular rhythm present. Exam reveals no gallop.   Murmur heard.  Systolic murmur is present with a grade of 2/6  Pulses:      Dorsalis pedis pulses are 2+ on the right side, and 2+ on the left side.  Respiratory: No respiratory distress. She has no wheezes. She has no rhonchi. She has no rales.  GI: Soft. Bowel sounds are normal. There is no tenderness.  Musculoskeletal:       Right ankle: She exhibits no swelling.       Left ankle: She exhibits no  swelling.  Lymphadenopathy:    She has no cervical adenopathy.  Neurological: She is alert. No cranial nerve deficit.  Intact sensation bilateral lower extremities. Patient able to wiggle toes bilaterally  Skin: Skin is warm. Nails show no clubbing.  Nursing staff documenting a stage I decubitus ulcer on buttock. No skin breakdown but discoloration of skin. Patient also has bruising upper right chest and neck.  Psychiatric: She has a normal mood and affect.    Data Reviewed: Basic Metabolic Panel:  Recent Labs Lab 09/15/15 1036 09/16/15 0258 09/17/15 0606 09/18/15 0557  NA 140 143 144 145  K 4.5 4.8 4.3 4.0  CL 108 109 112* 111  CO2 29 26 28 29   GLUCOSE 125* 145* 141* 125*  BUN 31* 32* 32* 36*  CREATININE 1.17* 1.14* 1.07* 1.07*  CALCIUM 8.9 9.1 8.7* 8.7*   Liver Function Tests:  Recent Labs Lab 09/15/15 1038  ALBUMIN 3.6   CBC:  Recent Labs Lab 09/15/15 1036 09/16/15 0258 09/17/15 0606 09/18/15 0557  WBC 10.0 8.2 9.3 10.4  NEUTROABS 8.6* 6.0  --   --   HGB 12.2 12.0 11.2* 11.1*  HCT 36.6 36.0 33.2* 33.8*  MCV 93.3 93.9 93.9 94.2  PLT 140* 121* 115* 121*     Recent Results (from the past 240 hour(s))  Urine culture     Status: None   Collection Time: 09/15/15 10:37 AM  Result Value Ref Range Status   Specimen Description URINE, RANDOM  Final   Special Requests NONE  Final   Culture NO GROWTH 1 DAY  Final   Report Status 09/16/2015 FINAL  Final  Surgical pcr screen     Status: None   Collection Time: 09/15/15  3:23 PM  Result Value Ref Range Status   MRSA, PCR NEGATIVE NEGATIVE Final   Staphylococcus aureus NEGATIVE NEGATIVE Final    Comment:        The Xpert SA Assay (FDA approved for NASAL specimens in patients over 40 years of age), is one component of a comprehensive surveillance program.  Test performance has been validated by [facility] for patients greater than or equal to 1 year old. It is not intended to diagnose infection nor  to guide or monitor treatment.      Studies: Dg C-arm 61-120 Min  09/16/2015  CLINICAL DATA:  Internal fixation of RIGHT femur fracture EXAM: DG C-ARM 61-120 MIN; RIGHT FEMUR 2 VIEWS COMPARISON:  09/15/2015 FINDINGS: Intra medullary nail fixation of the intertrochanteric fracture on the RIGHT. Single distal locking screw noted. Adjacent to the tip of the distal interlocking cortical screw, there is a short approximately 5 mm fragment of cortical elevation. IMPRESSION: Intra medullary nail fixation of intertrochanteric RIGHT femur fracture Electronically Signed   By: Genevive Bi M.D.   On: 09/16/2015 14:38   Dg Femur, Min 2 Views Right  09/16/2015  CLINICAL DATA:  Internal fixation of RIGHT femur fracture EXAM: DG C-ARM 61-120 MIN; RIGHT FEMUR 2 VIEWS COMPARISON:  09/15/2015 FINDINGS: Intra medullary nail fixation of the intertrochanteric fracture on the RIGHT. Single distal locking screw noted. Adjacent to the tip of the distal interlocking cortical screw, there is a short approximately 5 mm fragment of cortical elevation. IMPRESSION: Intra medullary nail fixation of intertrochanteric RIGHT femur fracture Electronically Signed   By: Genevive Bi M.D.   On: 09/16/2015 14:38   Dg Femur Port, Min 2 Views Right  09/16/2015  CLINICAL DATA:  Intertrochanteric fracture of the proximal right femur. EXAM: RIGHT FEMUR PORTABLE 1 VIEW COMPARISON:  Radiographs dated 09/15/2015 FINDINGS: The patient has undergone open reduction and internal fixation of the comminuted intertrochanteric fracture. There is an intramedullary nail and lag screw across the comminuted fracture of the proximal femur. Alignment and position of the fracture fragments is much improved. Lesser trochanter fragment remains displaced. IMPRESSION: Open reduction and internal fixation of comminuted proximal femur fracture. Electronically Signed   By: Francene Boyers M.D.   On: 09/16/2015 15:45    Scheduled Meds: . apixaban  5 mg Oral  BID  . bethanechol  10 mg Oral TID  . calcitonin (salmon)  1 spray Alternating Nares Daily  . citalopram  10 mg Oral Daily  . docusate sodium  100 mg Oral BID  . ferrous sulfate  325 mg Oral TID PC  . furosemide  40 mg Oral Daily  . lisinopril  2.5 mg Oral [id number]  . metoprolol  150 mg Oral BID  . senna  1 tablet Oral BID    Assessment/Plan: 1. Atrial fibrillation. Continue metoprolol 150 mg twice a day for rate control. Eliquisfor anticoagulation. 2. Right hip fracture closed requiring operative repair. Physical therapy evaluation. Likely to rehabilitation on postoperative day 3. 3. History of congestive heart failure- fluids and stopped yesterday. Lasix are restarted. Patient also on Isuprel metoprolol. 4. Essential hypertension- blood pressure stable 5. Acute respiratory failure with hypoxia- pulse ox 84% documented on room air. Continue oxygen supplementation. Incentive spirometry. 6. History of depression on Celexa  7. Patient in the memory unit at [facility]. 8. Stage I pressure ulcer on buttock as per nursing staff. Dressings applied. 9. Difficulty with urination. Patient needed a straight catheter this morning. I will try a few doses of Urecholine.   Code Status:     Code Status Orders        Start     Ordered   09/15/15 1233  Do not attempt resuscitation (DNR)   Continuous    Question Answer Comment  In the event of cardiac or respiratory ARREST Do not call a "code blue"   In the event of cardiac or respiratory ARREST Do not perform Intubation, CPR, defibrillation or ACLS   In the event of cardiac or respiratory ARREST Use medication by any route, position, wound care, and other measures to relive pain and suffering. May use oxygen, suction and manual treatment of airway obstruction as needed for comfort.      09/15/15 1232    Advance Directive Documentation        Most Recent Value   Type of Advance Directive  Out of facility DNR (pink MOST or yellow form)    Pre-existing out of facility DNR order (yellow form or pink MOST form)     "MOST" Form in Place?       Family Communication: Family at bedside Disposition Plan: Potentially to rehabilitation postoperatively day 3.  Consultants:  Orthopedic surgery  Time spent: 20 minutes  Alford Highland  [facility]

## 2015-09-18 NOTE — Progress Notes (Signed)
Pt more alert today. Still unable to void I&O cath pt tolerated well.,575 obtained. Both surgeon and medical aware. Medication to assist with independent urination given at 1330.

## 2015-09-18 NOTE — Progress Notes (Signed)
Subjective:  POD #2   Patient OOB to chair.  Daughter is in the room.  Patient more alert and much improved today.  Patient reports pain as mild.  She is comfortable after receiving tylenol for pain.  Objective:   VITALS:   Filed Vitals:   09/17/15 1927 09/18/15 0447 09/18/15 0800 09/18/15 0949  BP: 116/83 119/76 114/78   Pulse: 84 92 109   Temp: 98.4 F (36.9 C) 98.8 F (37.1 C) 98.3 F (36.8 C)   TempSrc: Oral Oral Oral   Resp: Height:      Weight:      SpO2: 100% 100% 100% 84%    PHYSICAL EXAM:  Right lower extremity:   Neurologically intact Neurovascular intact Sensation intact distally Intact pulses distally Dorsiflexion/Plantar flexion intact Incision: scant drainage No cellulitis present Compartment soft  LABS  Results for orders placed or performed during the hospital encounter of 09/15/15 (from the past 24 hour(s))  CBC     Status: Abnormal   Collection Time: 09/18/15  5:57 AM  Result Value Ref Range   WBC 10.4 3.6 - 11.0 K/uL   RBC 3.59 (L) 3.80 - 5.20 MIL/uL   Hemoglobin 11.1 (L) 12.0 - 16.0 g/dL   HCT 16.1 (L) 09.6 - 04.5 %   MCV 94.2 80.0 - 100.0 fL   MCH 30.9 26.0 - 34.0 pg   MCHC 32.8 32.0 - 36.0 g/dL   RDW 40.9 (H) 81.1 - 91.4 %   Platelets 121 (L) 150 - 440 K/uL  Basic metabolic panel     Status: Abnormal   Collection Time: 09/18/15  5:57 AM  Result Value Ref Range   Sodium 145 135 - 145 mmol/L   Potassium 4.0 3.5 - 5.1 mmol/L   Chloride 111 101 - 111 mmol/L   CO2 29 22 - 32 mmol/L   Glucose, Bld 125 (H) 65 - 99 mg/dL   BUN 36 (H) 6 - 20 mg/dL   Creatinine, Ser 7.82 (H) 0.44 - 1.00 mg/dL   Calcium 8.7 (L) 8.9 - 10.3 mg/dL   GFR calc non Af Amer 46 (L) >60 mL/min   GFR calc Af Amer 53 (L) >60 mL/min   Anion gap 5 5 - 15    Dg C-arm 61-120 Min  09/16/2015  CLINICAL DATA:  Internal fixation of RIGHT femur fracture EXAM: DG C-ARM 61-120 MIN; RIGHT FEMUR 2 VIEWS COMPARISON:  09/15/2015 FINDINGS: Intra medullary nail fixation  of the intertrochanteric fracture on the RIGHT. Single distal locking screw noted. Adjacent to the tip of the distal interlocking cortical screw, there is a short approximately 5 mm fragment of cortical elevation. IMPRESSION: Intra medullary nail fixation of intertrochanteric RIGHT femur fracture Electronically Signed   By: Genevive Bi M.D.   On: 09/16/2015 14:38   Dg Femur, Min 2 Views Right  09/16/2015  CLINICAL DATA:  Internal fixation of RIGHT femur fracture EXAM: DG C-ARM 61-120 MIN; RIGHT FEMUR 2 VIEWS COMPARISON:  09/15/2015 FINDINGS: Intra medullary nail fixation of the intertrochanteric fracture on the RIGHT. Single distal locking screw noted. Adjacent to the tip of the distal interlocking cortical screw, there is a short approximately 5 mm fragment of cortical elevation. IMPRESSION: Intra medullary nail fixation of intertrochanteric RIGHT femur fracture Electronically Signed   By: Genevive Bi M.D.   On: 09/16/2015 14:38   Dg Femur Port, Min 2 Views Right  09/16/2015  CLINICAL DATA:  Intertrochanteric fracture of the proximal right femur. EXAM: RIGHT FEMUR  PORTABLE 1 VIEW COMPARISON:  Radiographs dated 09/15/2015 FINDINGS: The patient has undergone open reduction and internal fixation of the comminuted intertrochanteric fracture. There is an intramedullary nail and lag screw across the comminuted fracture of the proximal femur. Alignment and position of the fracture fragments is much improved. Lesser trochanter fragment remains displaced. IMPRESSION: Open reduction and internal fixation of comminuted proximal femur fracture. Electronically Signed   By: Francene BoyersJames  Maxwell M.D.   On: 09/16/2015 15:45    Assessment/Plan: 2 Days Post-Op   Active Problems:   Hip fracture, right (HCC)   Atrial fibrillation (HCC)   Hip fracture (HCC)   Pressure ulcer  Patient improved today.  Hemoglobin and hematocrit are stable.  Patient started on Eliquis for A. Fib which should cover her for DVT  prophylaxis as well.  Continue with PT/OT.  Encourage IS while awake.  Recheck labs in AM.  Continue tylenol for pain control.     Juanell FairlyKRASINSKI, Madelon Welsch , MD 09/18/2015, 12:32 PM

## 2015-09-18 NOTE — Progress Notes (Signed)
Lydia [name] unable to void post foley removal, bladder scan 300ml. [facility], Lydia Lelon PerlaSaunders unable to void. Dr Anne HahnWillis, MD on call notified, states to continue monitoring and let MD know if bladder scan volume gets closer to 500ml and still unable to void. Nursing continues to monitor.

## 2015-09-18 NOTE — Progress Notes (Signed)
Patient still unable to void.  It has been 8 hours since last in and out.  367ml on bladder scan.  Dr Amado CoeGouru said to in and out cath the pt in 2 hours if no void

## 2015-09-18 NOTE — Progress Notes (Signed)
Pt. In and out cathed. Amount was 375cc.

## 2015-09-18 NOTE — Progress Notes (Signed)
[name], unable to void, bladder scan 420ml. Nursing assisted Mrs Lydia [name] to drink more fluids. Dr Sheryle Hailiamond, MD on call notified, stated to continue to monitor as long as patient is not uncomfortable and complaining that she has to use the bathroom. Nursing continues to monitor.

## 2015-09-18 NOTE — Progress Notes (Signed)
Plan [facility] for [facility]. Patient's daughter Gavin PoundDeborah [facility]. [facility] is aware of [facility]. Clinical Social Worker (CSW) will continue to follow and assist as needed.   Jetta LoutBailey Morgan, LCSWA [phone]

## 2015-09-18 NOTE — Care Management Important Message (Signed)
Important Message  Patient Details  Name: Jeraldine LootsBetty G Dominic MRN: [id number] Date of Birth: 02/12/1928   Medicare Important Message Given:  Yes-third notification given    Olegario MessierKathy A Allmond 09/18/2015, 1:47 PM

## 2015-09-18 NOTE — Progress Notes (Signed)
Physical Therapy Treatment Patient Details Name: Lydia [name] MRN: [id number] DOB: 09/29/1928 Today's Date: 09/18/2015    History of Present Illness Lydia [name] is a 81 y.o. female who complains of right hip pain status post fall. Patient has dementia and is unable to provide accurate history regarding the mechanism of injury. Her daughters are at the bedside. They report the patient is walking without her walker which she uses normally at baseline for assistance with ambulation. Patient has a history of atrial fibrillation and is on Coumadin at baseline. She is postop day #1 status post intramedullary fixation for right intertrochanteric hip fracture.    PT Comments    Pt progressing toward goals; able to ambulate with mod assist +2 and RW x 8 ft. Pt displays gait deviations consistent with antalgic gait favoring the RLE; decreased stance time on R, decreased weight shift toward R, decreased step length on the L. PT provided weight shift encouragement toward the R w/ tc given at posterior aspect of L hip to encourage larger L step length; pt responded well to intervention. Pt remains mod assist +2 for all other mobility tasks.   Follow Up Recommendations  SNF     Equipment Recommendations  Rolling walker with 5" wheels    Recommendations for Other Services       Precautions / Restrictions Precautions Precautions: Fall Restrictions Weight Bearing Restrictions: Yes RLE Weight Bearing: Weight bearing as tolerated    Mobility  Bed Mobility Overal bed mobility: Needs Assistance Bed Mobility: Sit to Supine     Supine to sit: Max assist Sit to supine: Mod assist   General bed mobility comments: assistance required for controlling pt's feet up onto bed during sit<>supine transfer  Transfers Overall transfer level: Needs assistance Equipment used: Rolling walker (2 wheeled) Transfers: Sit to/from Stand Sit to Stand: Mod assist Stand pivot transfers: Mod assist        General transfer comment: Pt has difficulty scooting herself toward edge of seating surface in order to ease sit<>stand transfer; pt still struggling with following directions  Ambulation/Gait Ambulation/Gait assistance: Mod assist;+2 physical assistance Ambulation Distance (Feet): 10 Feet Assistive device: Rolling walker (2 wheeled) Gait Pattern/deviations: Step-to pattern;Decreased step length - left;Antalgic;Decreased stance time - right;Decreased dorsiflexion - right;Decreased dorsiflexion - left;Decreased weight shift to right Gait velocity: decreased Gait velocity interpretation: <1.8 ft/sec, indicative of risk for recurrent falls General Gait Details: Pt has anatalgic gait favoring R side and associated gait deviations; decreased weight shifting toward R and decreased stance time on R leading to a decreased step length with the L   Stairs            Wheelchair Mobility    Modified Rankin (Stroke Patients Only)       Balance Overall balance assessment: Needs assistance Sitting-balance support: Bilateral upper extremity supported;Feet supported Sitting balance-Leahy Scale: Fair Sitting balance - Comments: able to maintain seated balance without PT assist today; still requires BUE support Postural control: Posterior lean Standing balance support: Bilateral upper extremity supported;During functional activity Standing balance-Leahy Scale: Poor Standing balance comment: pt requires PT assist (mod) and BUE support on RW to maintain standing balance                    Cognition Arousal/Alertness: Lethargic Behavior During Therapy: WFL for tasks assessed/performed Overall Cognitive Status: History of cognitive impairments - at baseline       Memory: Decreased short-term memory  Exercises Total Joint Exercises Ankle Circles/Pumps: AROM;10 reps;Supine;Both Hip ABduction/ADduction: AAROM;Right;10 reps;Supine Other Exercises Other Exercises:  sit<>stand x 3 with mod assist +2 and RW; vc for pushing through bed with BUEs and hip flexion to facilitate effciency of movement (1,2,3 countdoqn provided to cue patient to perform activity)    General Comments        Pertinent Vitals/Pain Pain Assessment:  (makes pain known when moving; pt does not complain of any pain when stationary) Pain Intervention(s): Monitored during session;Repositioned;Limited activity within patient's tolerance    Home Living                      Prior Function            PT Goals (current goals can now be found in the care plan section) Acute Rehab PT Goals Patient Stated Goal: to go to SNF PT Goal Formulation: With patient/family Time For Goal Achievement: 10/01/15 Potential to Achieve Goals: Good Progress towards PT goals: Progressing toward goals    Frequency  BID    PT Plan Current plan remains appropriate    Co-evaluation             End of Session Equipment Utilized During Treatment: Gait belt Activity Tolerance: Patient limited by fatigue;Patient limited by lethargy;No increased pain Patient left: in bed;with family/visitor present;with call bell/phone within reach;with bed alarm set;with SCD's reapplied     Time: 6962-9528 PT Time Calculation (min) (ACUTE ONLY): 26 min  Charges:  $Therapeutic Exercise: 8-22 mins $Therapeutic Activity: 8-22 mins                    G Codes:      Georgina Peer 09/18/2015, 1:43 PM

## 2015-09-18 NOTE — Progress Notes (Signed)
Physical Therapy Treatment Patient Details Name: Jeraldine LootsBetty G Swingler MRN: [id number] DOB: 03/19/1928 Today's Date: 09/18/2015    History of Present Illness Jeraldine LootsBetty G Laton is a 81 y.o. female who complains of right hip pain status post fall. Patient has dementia and is unable to provide accurate history regarding the mechanism of injury. Her daughters are at the bedside. They report the patient is walking without her walker which she uses normally at baseline for assistance with ambulation. Patient has a history of atrial fibrillation and is on Coumadin at baseline. She is postop day #1 status post intramedullary fixation for right intertrochanteric hip fracture.    PT Comments    Pt remains lethargic and unresponsive to commands when left in supine in bed. Upon arrival to room pt was not wearing [place] with supplemental O2; sats were checked and found to be 84% SpO2; nurse was notified and 2L of O2 via [place] was readministered. Pt is more awake and alert when transferred with max assist into sitting on EOB position. PT provided vc for sequencing of transfer but pt unable to follow. Pt unable to actively move R leg toward EOB to facilitate transfer; pt does not possess the upper body strength to pull herself up with bed rails. Once in sitting, pt able to display improved sitting balance; able to maintain sitting posture without PT support but with BUE support. Pt performed sit<>stand x 3 with mod assist +2; cues for pushing down through bed and hip flexion to facilitate efficiency of transfer. Pt performed stand pivot transfer with mod assist +2; PT assistance provided for advancement of walker. Pt will continue to benefit from skilled acute PT services in order to promote increases in functional mobility performance and independence.   Follow Up Recommendations  SNF     Equipment Recommendations  Rolling walker with 5" wheels    Recommendations for Other Services       Precautions / Restrictions  Precautions Precautions: Fall Restrictions Weight Bearing Restrictions: Yes RLE Weight Bearing: Weight bearing as tolerated    Mobility  Bed Mobility Overal bed mobility: Needs Assistance Bed Mobility: Supine to Sit     Supine to sit: Max assist     General bed mobility comments: attempted to provide more vc and demonstration for increased independence with bed mobility; pt unable to follow cues.   Transfers Overall transfer level: Needs assistance Equipment used: Rolling walker (2 wheeled) Transfers: Sit to/from [employer] CorporationStand;Stand Pivot Transfers Sit to Stand: Mod assist Stand pivot transfers: Mod assist (assistance required for movement of walker in correct direction)       General transfer comment: pt benefits from a countdown to 3 before standing up in order to understand what is required of her; vc provided for pushing down through bed and hip flexion when coming into standing  Ambulation/Gait Ambulation/Gait assistance: Mod assist Ambulation Distance (Feet): 3 Feet Assistive device: Rolling walker (2 wheeled) Gait Pattern/deviations: Shuffle;Decreased dorsiflexion - right;Decreased dorsiflexion - left;Decreased step length - right;Decreased step length - left;Decreased stance time - right;Decreased stride length;Step-to pattern Gait velocity: decreased Gait velocity interpretation: <1.8 ft/sec, indicative of risk for recurrent falls     Stairs            Wheelchair Mobility    Modified Rankin (Stroke Patients Only)       Balance Overall balance assessment: Needs assistance Sitting-balance support: Bilateral upper extremity supported;Feet supported Sitting balance-Leahy Scale: Fair Sitting balance - Comments: able to maintain seated balance without PT assist today; still requires  BUE support Postural control: Posterior lean Standing balance support: Bilateral upper extremity supported;During functional activity Standing balance-Leahy Scale: Poor Standing  balance comment: pt requires PT assist (mod) and BUE support on RW to maintain standing balance                    Cognition Arousal/Alertness: Lethargic Behavior During Therapy: WFL for tasks assessed/performed Overall Cognitive Status: History of cognitive impairments - at baseline                      Exercises Total Joint Exercises Ankle Circles/Pumps: AROM;10 reps;Supine;Both Hip ABduction/ADduction: AAROM;Right;10 reps;Supine Other Exercises Other Exercises: sit<>stand x 3 with mod assist +2 and RW; vc for pushing through bed with BUEs and hip flexion to facilitate effciency of movement (1,2,3 countdoqn provided to cue patient to perform activity)    General Comments        Pertinent Vitals/Pain Pain Assessment:  (makes pain known when moving; pt does not complain of any pain when stationary)    Home Living                      Prior Function            PT Goals (current goals can now be found in the care plan section) Acute Rehab PT Goals Patient Stated Goal: to go to SNF PT Goal Formulation: With patient/family Time For Goal Achievement: 10/01/15 Potential to Achieve Goals: Good Progress towards PT goals: Progressing toward goals    Frequency  BID    PT Plan Current plan remains appropriate    Co-evaluation             End of Session Equipment Utilized During Treatment: Gait belt Activity Tolerance: Patient tolerated treatment well Patient left: in chair;with family/visitor present;with call bell/phone within reach     Time: 1610-9604 PT Time Calculation (min) (ACUTE ONLY): 27 min  Charges:                       G Codes:      Isaiah Blakes ,SPT 09/18/2015, 10:06 AM

## 2015-09-18 NOTE — Progress Notes (Signed)
BP low. I spoke with MD.   Changes being made to medications

## 2015-09-18 NOTE — Progress Notes (Signed)
Nursing assisted Lydia [name] to use BR, still unable to void, bladder scan 300ml. Nursing assisting Lydia [name] to drink more fluids. Nursing continues to monitor per MD until volume closer to 500ml.

## 2015-09-19 LAB — CBC
HCT: 32.1 % — ABNORMAL LOW (ref 35.0–47.0)
Hemoglobin: 10.4 g/dL — ABNORMAL LOW (ref 12.0–16.0)
MCH: 30.6 pg (ref 26.0–34.0)
MCHC: 32.4 g/dL (ref 32.0–36.0)
MCV: 94.5 fL (ref 80.0–100.0)
PLATELETS: 139 10*3/uL — AB (ref 150–440)
RBC: 3.4 MIL/uL — ABNORMAL LOW (ref 3.80–5.20)
RDW: 14.9 % — AB (ref 11.5–14.5)
WBC: 8 10*3/uL (ref 3.6–11.0)

## 2015-09-19 LAB — BASIC METABOLIC PANEL
Anion gap: 5 (ref 5–15)
BUN: 36 mg/dL — AB (ref 6–20)
CO2: 31 mmol/L (ref 22–32)
CREATININE: 1.07 mg/dL — AB (ref 0.44–1.00)
Calcium: 8.4 mg/dL — ABNORMAL LOW (ref 8.9–10.3)
Chloride: 108 mmol/L (ref 101–111)
GFR calc Af Amer: 53 mL/min — ABNORMAL LOW (ref >60)
GFR, EST NON AFRICAN AMERICAN: 46 mL/min — AB (ref >60)
GLUCOSE: 118 mg/dL — AB (ref 65–99)
Potassium: 4.3 mmol/L (ref 3.5–5.1)
SODIUM: 144 mmol/L (ref 135–145)

## 2015-09-19 MED ORDER — METOPROLOL TARTRATE 50 MG PO TABS
50.0000 mg | ORAL_TABLET | Freq: Three times a day (TID) | ORAL | Status: DC
  Administered 2015-09-19 (×2): 50 mg via ORAL
  Filled 2015-09-19 (×2): qty 1

## 2015-09-19 MED ORDER — METOPROLOL TARTRATE 50 MG PO TABS: 50.0000 mg | ORAL_TABLET | Freq: Three times a day (TID) | ORAL | Status: DC

## 2015-09-19 MED ORDER — TRAMADOL HCL 50 MG PO TABS: 25.0000 mg | ORAL_TABLET | Freq: Three times a day (TID) | ORAL | Status: DC | PRN

## 2015-09-19 MED ORDER — FERROUS SULFATE 325 (65 FE) MG PO TABS: 325.0000 mg | ORAL_TABLET | Freq: Every day | ORAL | Status: DC

## 2015-09-19 MED ORDER — POTASSIUM CHLORIDE CRYS ER 20 MEQ PO TBCR: 20.0000 meq | EXTENDED_RELEASE_TABLET | Freq: Every day | ORAL | Status: DC

## 2015-09-19 MED ORDER — FUROSEMIDE 40 MG PO TABS: 20.0000 mg | ORAL_TABLET | Freq: Every day | ORAL | Status: DC

## 2015-09-19 MED ORDER — METOPROLOL TARTRATE 75 MG PO TABS: 150.0000 mg | ORAL_TABLET | Freq: Two times a day (BID) | ORAL | Status: DC

## 2015-09-19 MED ORDER — APIXABAN 5 MG PO TABS: 5.0000 mg | ORAL_TABLET | Freq: Two times a day (BID) | ORAL | Status: DC

## 2015-09-19 MED ORDER — METOPROLOL TARTRATE 50 MG PO TABS
50.0000 mg | ORAL_TABLET | Freq: Once | ORAL | Status: AC
  Administered 2015-09-19: 50 mg via ORAL
  Filled 2015-09-19: qty 1

## 2015-09-19 MED ORDER — METOPROLOL TARTRATE 100 MG PO TABS
150.0000 mg | ORAL_TABLET | Freq: Two times a day (BID) | ORAL | Status: DC
  Administered 2015-09-19 – 2015-09-20 (×3): 150 mg via ORAL
  Filled 2015-09-19 (×3): qty 1

## 2015-09-19 NOTE — Clinical Social Work Placement (Signed)
   CLINICAL SOCIAL WORK PLACEMENT  NOTE  Date:  09/19/2015  Patient Details  Name: Lydia [name] MRN: [id number] Date of Birth: 03/13/1928  Clinical Social Work is seeking post-discharge placement for this patient at the Skilled  Nursing Facility level of care (*CSW will initial, date and re-position this form in  chart as items are completed):  Yes   Patient/family provided with [facility] Clinical Social Work Department's list of facilities offering this level of care within the geographic area requested by the patient (or if unable, by the patient's family).  Yes   Patient/family informed of their freedom to choose among providers that offer the needed level of care, that participate in Medicare, Medicaid or managed care program needed by the patient, have an available bed and are willing to accept the patient.  Yes   Patient/family informed of 's ownership interest in [facility] and [facility], as well as of the fact that they are under no obligation to receive care at these facilities.  PASRR submitted to EDS on       PASRR number received on       Existing PASRR number confirmed on 09/16/15     FL2 transmitted to all facilities in geographic area requested by pt/family on 09/16/15     FL2 transmitted to all facilities within larger geographic area on       Patient informed that his/her managed care company has contracts with or will negotiate with certain facilities, including the following:        Yes   Patient/family informed of bed offers received.  Patient chooses bed at  [facility] )     Physician recommends and patient chooses bed at      Patient to be transferred to  [facility] ) on 09/19/15.  Patient to be transferred to facility by  [facility] )     Patient family notified on 09/19/15 of transfer.  Name of family member notified:   (Daughter Gavin PoundDeborah at bedside. )     PHYSICIAN       Additional Comment:     _______________________________________________ Haig ProphetMorgan, Naysha Sholl G, LCSW 09/19/2015, 9:25 AM

## 2015-09-19 NOTE — Progress Notes (Signed)
Checked to see if pre-authorization would be needed for non-emergent EMS transport.  Per [employer]’s automated system, [phone], patient has a [employer] Medicare Advantage PPO policy.  [employer] Medicare PPO plans do not require pre-auth for non-emergent ground transports using service codes A0426 or A0428.   °

## 2015-09-19 NOTE — Progress Notes (Signed)
Spoke with Dr Renae GlossWieting several times today concerning increased heart rate  MD has adjusted lopressor, Dr Renae GlossWieting does not want lopressor held for blood pressure as lopressor was held last night

## 2015-09-19 NOTE — Progress Notes (Signed)
Pt  Was 2 assist with walker  to bedside commode voided while having large green brown formed stool, in and out cath post voiding 400 tea colored urine

## 2015-09-19 NOTE — Progress Notes (Addendum)
Subjective:  Postoperative day #3 status post intramedullary fixation for right intertrochanteric hip fracture. Patient reports pain as mild.  Patient is lying in bed comfortably. Her daughter is at the bedside. The daughter explains that the patient did well with physical therapy today.  Objective:   VITALS:   Filed Vitals:   09/19/15 [id number] 09/19/15 0950 09/19/15 1159 09/19/15 1408  BP:    111/86  Pulse: 104 148 127 51  Temp:      TempSrc:      Resp:    18  Height:      Weight:      SpO2:  95% 96% 96%    PHYSICAL EXAM:  Examination of the right lower extremity revealed the right lower extremity is Neurovascular intact Sensation intact distally Intact pulses distally Dorsiflexion/Plantar flexion intact Incision: dressing C/D/I No cellulitis present Compartment soft  LABS  Results for orders placed or performed during the hospital encounter of 09/15/15 (from the past 24 hour(s))  CBC     Status: Abnormal   Collection Time: 09/19/15  5:45 AM  Result Value Ref Range   WBC 8.0 3.6 - 11.0 K/uL   RBC 3.40 (L) 3.80 - 5.20 MIL/uL   Hemoglobin 10.4 (L) 12.0 - 16.0 g/dL   HCT 96.032.1 (L) 45.435.0 - 09.847.0 %   MCV 94.5 80.0 - 100.0 fL   MCH 30.6 26.0 - 34.0 pg   MCHC 32.4 32.0 - 36.0 g/dL   RDW 11.914.9 (H) 14.711.5 - 82.914.5 %   Platelets 139 (L) 150 - 440 K/uL  Basic metabolic panel     Status: Abnormal   Collection Time: 09/19/15  5:45 AM  Result Value Ref Range   Sodium 144 135 - 145 mmol/L   Potassium 4.3 3.5 - 5.1 mmol/L   Chloride 108 101 - 111 mmol/L   CO2 31 22 - 32 mmol/L   Glucose, Bld 118 (H) 65 - 99 mg/dL   BUN 36 (H) 6 - 20 mg/dL   Creatinine, Ser 5.621.07 (H) 0.44 - 1.00 mg/dL   Calcium 8.4 (L) 8.9 - 10.3 mg/dL   GFR calc non Af Amer 46 (L) >60 mL/min   GFR calc Af Amer 53 (L) >60 mL/min   Anion gap 5 5 - 15    No results found.  Assessment/Plan: 3 Days Post-Op   Active Problems:   Hip fracture, right (HCC)   Atrial fibrillation (HCC)   Hip fracture (HCC)   Pressure  ulcer  Patient is doing well postop. She is ready for an orthopedic standpoint for discharge. She should continue receiving Eliquis for A. fib stroke prevention and DVT prophylaxis. Continue Tylenol for pain. Patient should continue physical therapy for lower 70 strengthening, hip range of motion and gait training. She'll follow-up with me in 10-14 days for wound check, staple removal and x-ray in my office.  ORTHOPAEDIC DISCHARGE INSTRUCTIONS:  Patient has dementia and therefore may weight-bear as tolerated on the left lower extremity with the assistance of a walker. She will not be able to comply with partial weightbearing instructions. Patient should continue to receive physical therapy for hip and lower extremity range of motion, strengthening and gait training. She should elevate the left lower extremity whenever possible. She may apply ice to the surgical site to help reduce swelling. Dressing should be changed when necessary basis if drainage is observed. Continue Eliquis for DVT prophylaxis. Patient will follow with orthopedic surgeon in his office in 10-14 days. Staples will be removed in the orthopedic  office.  [facility], Dr. Martha Clan, [phone]    Juanell Fairly , MD 09/19/2015, 2:14 PM

## 2015-09-19 NOTE — Discharge Summary (Addendum)
Give the cards [facility] [place] at [facility]   PATIENT NAME: Lydia [name]    MR#:  [id number]  DATE OF BIRTH:  [birth year]  DATE OF ADMISSION:  09/15/2015 ADMITTING PHYSICIAN: Juanell Fairly, MD  DATE OF DISCHARGE: 09/21/2015  PRIMARY CARE PHYSICIAN: Clydie Braun, MD    ADMISSION DIAGNOSIS:  Hip fracture, right, closed, initial encounter (HCC) [S72.001A]  DISCHARGE DIAGNOSIS:  Active Problems:   Hip fracture, right (HCC)   Atrial fibrillation (HCC)   Hip fracture (HCC)   Pressure ulcer   SECONDARY DIAGNOSIS:   Past Medical History  Diagnosis Date  . Hypertension   . A-fib [facility])     HOSPITAL COURSE:   1. Right hip fracture closed requiring operative repair. Please see operative report by Dr. Martha Clan. Patient will be transferred out to rehab. 2. Rapid Atrial fibrillation and relative hypotension- patient on 150 mg of metoprolol twice a day. On 09/20/2015 I added Cardizem CD 120 mg daily in the afternoon. I also gave to doses of IV digoxin. Would like to see heart rate less than 110 sustained prior to discharge to rehabilitation. Should be able to come off digoxin. Eliquis for anticoagulation started postoperatively. 3. Acute respiratory failure with hypoxia- patient taking shallow breaths. Continue oxygen supplementation if needed for pulse ox less than 88%. 4. Difficulty with urination- straight catheter every 8 hours as needed for inability to urinate. I did give a couple doses of Urecholine here in the hospital. 5. History of congestive heart failure- lungs sound clear. Low-dose Lasix and metoprolol. No signs of heart failure currently. 6. History of depression and dementia on Celexa 7. Stage I pressure ulcer on buttock- rotate patient every couple hours. Covered with a dressing.  DISCHARGE CONDITIONS:   Satisfactory  CONSULTS OBTAINED:  Treatment Team:  Juanell Fairly, MD Lamar Blinks, MD  DRUG ALLERGIES:  No Known  Allergies  DISCHARGE MEDICATIONS:   Current Discharge Medication List    START taking these medications   Details  apixaban (ELIQUIS) 5 MG TABS tablet Take 1 tablet (5 mg total) by mouth 2 (two) times daily. Qty: 60 tablet    diltiazem (CARDIZEM CD) 120 MG 24 hr capsule Take 1 capsule (120 mg total) by mouth daily. At 1pm    feeding supplement, ENSURE ENLIVE, (ENSURE ENLIVE) LIQD Take 237 mLs by mouth 2 (two) times daily between meals. Qty: 237 mL, Refills: 12    ferrous sulfate 325 (65 FE) MG tablet Take 1 tablet (325 mg total) by mouth daily with breakfast. Refills: 3      CONTINUE these medications which have CHANGED   Details  furosemide (LASIX) 40 MG tablet Take 0.5 tablets (20 mg total) by mouth daily. Qty: 30 tablet    Metoprolol Tartrate 75 MG TABS Take 150 mg by mouth 2 (two) times daily. Qty: 120 tablet, Refills: 0    potassium chloride SA (K-DUR,KLOR-CON) 20 MEQ tablet Take 1 tablet (20 mEq total) by mouth daily.    traMADol (ULTRAM) 50 MG tablet Take 0.5 tablets (25 mg total) by mouth every 8 (eight) hours as needed. Take at 8am, 2pm, and 8pm. Qty: 30 tablet, Refills: 0      CONTINUE these medications which have NOT CHANGED   Details  acetaminophen (TYLENOL) 325 MG tablet Take 650 mg by mouth every 4 (four) hours as needed for mild pain or moderate pain.    barrier cream (NON-SPECIFIED) CREA Apply 1 application topically 2 (two) times daily. Apply to buttocks.  calcitonin, salmon, (MIACALCIN/FORTICAL) 200 UNIT/ACT nasal spray Place 1 spray into alternate nostrils daily.    citalopram (CELEXA) 10 MG tablet Take 10 mg by mouth daily.    Fluticasone-Salmeterol (ADVAIR DISKUS) 100-50 MCG/DOSE AEPB Inhale 1 puff into the lungs 2 (two) times daily.    polyethylene glycol powder (GLYCOLAX/MIRALAX) powder Take 17 g by mouth daily as needed. For constipation. Mis with 4-8oz of fluid.    triamcinolone (NASACORT) 55 MCG/ACT AERO nasal inhaler Place 2 sprays into the  nose daily.      STOP taking these medications     lisinopril (PRINIVIL,ZESTRIL) 2.5 MG tablet      warfarin (COUMADIN) 2.5 MG tablet          DISCHARGE INSTRUCTIONS:   Follow up with doctor at rehabilitation 1 day. Physical therapy at rehabilitation.  If you experience worsening of your admission symptoms, develop shortness of breath, life threatening emergency, suicidal or homicidal thoughts you must seek medical attention immediately by calling 911 or calling your MD immediately  if symptoms less severe.  You Must read complete instructions/literature along with all the possible adverse reactions/side effects for all the Medicines you take and that have been prescribed to you. Take any new Medicines after you have completely understood and accept all the possible adverse reactions/side effects.   Please note  You were cared for by a hospitalist during your hospital stay. If you have any questions about your discharge medications or the care you received while you were in the hospital after you are discharged, you can call the unit and asked to speak with the hospitalist on call if the hospitalist that took care of you is not available. Once you are discharged, your primary care physician will handle any further medical issues. Please note that NO REFILLS for any discharge medications will be authorized once you are discharged, as it is imperative that you return to your primary care physician (or establish a relationship with a primary care physician if you do not have one) for your aftercare needs so that they can reassess your need for medications and monitor your lab values.    Today   CHIEF COMPLAINT:   Chief Complaint  Patient presents with  . Fall    HISTORY OF PRESENT ILLNESS:  Lydia [name]  is a 81 y.o. female with a known history of dementia presented with fall and found to have a broken hip.   PHYSICAL EXAMINATION:  GENERAL:  81 y.o.-year-old patient lying in  the bed with no acute distress.  EYES: Pupils equal, round, reactive to light and accommodation. No scleral icterus. Extraocular muscles intact.  HEENT: Head atraumatic, normocephalic. Oropharynx and nasopharynx clear.  NECK:  Supple, no jugular venous distention. No thyroid enlargement, no tenderness.  LUNGS: Normal breath sounds bilaterally, no wheezing, rales,rhonchi or crepitation. No use of accessory muscles of respiration.  CARDIOVASCULAR: S1, S2 irregularly irregular tachycardic. 2 and a 6 systolic murmur, no rubs, or gallops.  ABDOMEN: Soft, non-tender, non-distended. Bowel sounds present. No organomegaly or mass.  EXTREMITIES: No pedal edema, cyanosis, or clubbing.  NEUROLOGIC: Cranial nerves II through XII are intact. Patient able to lift arms up in the air. Barely able to lift right leg up off the bed. Able to lift left leg up off the bed. Sensation intact. Gait not checked.  PSYCHIATRIC: The patient is alert.  SKIN: As per nursing staff, stage I decubiti buttock  DATA REVIEW:   CBC  Recent Labs Lab 09/19/15 0545  WBC 8.0  HGB 10.4*  HCT 32.1*  PLT 139*    Chemistries   Recent Labs Lab 09/19/15 0545  NA 144  K 4.3  CL 108  CO2 31  GLUCOSE 118*  BUN 36*  CREATININE 1.07*  CALCIUM 8.4*     Microbiology Results  Results for orders placed or performed during the hospital encounter of 09/15/15  Urine culture     Status: None   Collection Time: 09/15/15 10:37 AM  Result Value Ref Range Status   Specimen Description URINE, RANDOM  Final   Special Requests NONE  Final   Culture NO GROWTH 1 DAY  Final   Report Status 09/16/2015 FINAL  Final  Surgical pcr screen     Status: None   Collection Time: 09/15/15  3:23 PM  Result Value Ref Range Status   MRSA, PCR NEGATIVE NEGATIVE Final   Staphylococcus aureus NEGATIVE NEGATIVE Final    Comment:        The Xpert SA Assay (FDA approved for NASAL specimens in patients over 77 years of age), is one component of a  comprehensive surveillance program.  Test performance has been validated by [facility] for patients greater than or equal to 36 year old. It is not intended to diagnose infection nor to guide or monitor treatment.     Management plans discussed with the patient, family and they are in agreement.  CODE STATUS:     Code Status Orders        Start     Ordered   09/16/15 1608  Do not attempt resuscitation (DNR)   Continuous    Question Answer Comment  In the event of cardiac or respiratory ARREST Do not call a "code blue"   In the event of cardiac or respiratory ARREST Do not perform Intubation, CPR, defibrillation or ACLS   In the event of cardiac or respiratory ARREST Use medication by any route, position, wound care, and other measures to relive pain and suffering. May use oxygen, suction and manual treatment of airway obstruction as needed for comfort.      09/16/15 1607    Advance Directive Documentation        Most Recent Value   Type of Advance Directive  Out of facility DNR (pink MOST or yellow form)   Pre-existing out of facility DNR order (yellow form or pink MOST form)     "MOST" Form in Place?        TOTAL TIME TAKING CARE OF THIS PATIENT: 35 minutes.    Alford Highland M.D on 09/20/2015 at 3:46 PM  Between 7am to 6pm - Pager - [phone]  After 6pm go to [email or web address] - password [email or web address] [facility]  [facility]  Office  [phone]  CC: Primary care physician; Clydie Braun, MD

## 2015-09-19 NOTE — Discharge Instructions (Addendum)
ORTHOPAEDIC DISCHARGE INSTRUCTIONS:  Patient has dementia and therefore may weight-bear as tolerated on the left lower extremity with the assistance of a walker. She will not be able to comply with partial weightbearing instructions. Patient should continue to receive physical therapy for hip and lower extremity range of motion, strengthening and gait training. She should elevate the left lower extremity whenever possible. She may apply ice to the surgical site to help reduce swelling. Dressing should be changed when necessary basis if drainage is observed. Continue Eliquis for DVT prophylaxis. Patient will follow with orthopedic surgeon in his office in 10-14 days. Staples will be removed in the orthopedic office.  [facility], Dr. Martha ClanKrasinski, [phone]  Hip Fracture A hip fracture is a fracture of the upper part of your thigh bone (femur).  CAUSES A hip fracture is caused by a direct blow to the side of your hip. This is usually the result of a fall but can occur in other circumstances, such as an automobile accident. RISK FACTORS There is an increased risk of hip fractures in people with:  An unsteady walking pattern (gait) and those with conditions that contribute to poor balance, such as Parkinson's disease or dementia.  Osteopenia and osteoporosis.  Cancer that spreads to the leg bones.  Certain metabolic diseases. SYMPTOMS  Symptoms of hip fracture include:  Pain over the injured hip.  Inability to put weight on the leg in which the fracture occurred (although, some patients are able to walk after a hip fracture).  Toes and foot of the affected leg point outward when you lie down. DIAGNOSIS A physical exam can determine if a hip fracture is likely to have occurred. X-ray exams are needed to confirm the fracture and to look for other injuries. The X-ray exam can help to determine the type of hip fracture. Rarely, the fracture is not visible on an X-ray image and a CT scan  or MRI will have to be done. TREATMENT  The treatment for a fracture is usually surgery. This means using a screw, nail, or rod to hold the bones in place.  HOME CARE INSTRUCTIONS Take all medicines as directed by your health care provider. SEEK MEDICAL CARE IF: Pain continues, even after taking pain medicine. MAKE SURE YOU:  Understand these instructions.   Will watch your condition.  Will get help right away if you are not doing well or get worse.   This information is not intended to replace advice given to you by your health care provider. Make sure you discuss any questions you have with your health care provider.   Document Released: 11/16/2005 Document Revised: 11/21/2013 Document Reviewed: 06/28/2013 Elsevier Interactive Patient Education 2016 [employer]   DIET:  Cardiac diet  DISCHARGE CONDITION:  Stable  ACTIVITY:  As per ortho  OXYGEN:  Home Oxygen: No.   Oxygen Delivery: room air  DISCHARGE LOCATION:  nursing home    ADDITIONAL DISCHARGE INSTRUCTION: inout cath every 8 hours until patient able to void on her own   If you experience worsening of your admission symptoms, develop shortness of breath, life threatening emergency, suicidal or homicidal thoughts you must seek medical attention immediately by calling 911 or calling your MD immediately  if symptoms less severe.  You Must read complete instructions/literature along with all the possible adverse reactions/side effects for all the Medicines you take and that have been prescribed to you. Take any new Medicines after you have completely understood and accpet all the possible adverse reactions/side effects.  Please note  You were cared for by a hospitalist during your hospital stay. If you have any questions about your discharge medications or the care you received while you were in the hospital after you are discharged, you can call the unit and asked to speak with the hospitalist on call if the  hospitalist that took care of you is not available. Once you are discharged, your primary care physician will handle any further medical issues. Please note that NO REFILLS for any discharge medications will be authorized once you are discharged, as it is imperative that you return to your primary care physician (or establish a relationship with a primary care physician if you do not have one) for your aftercare needs so that they can reassess your need for medications and monitor your lab values.

## 2015-09-19 NOTE — Progress Notes (Signed)
Physical Therapy Treatment Patient Details Name: Lydia [name] MRN: [id number] DOB: 04/11/1928 Today's Date: 09/19/2015    History of Present Illness Lydia [name] is a 81 y.o. female who complains of right hip pain status post fall. Patient has dementia and is unable to provide accurate history regarding the mechanism of injury. Her daughters are at the bedside. They report the patient is walking without her walker which she uses normally at baseline for assistance with ambulation. Patient has a history of atrial fibrillation and is on Coumadin at baseline. She is postop day #1 status post intramedullary fixation for right intertrochanteric hip fracture.    PT Comments    Pt was much more awake and alert this morning upon arrival to room; pt was sitting up and interacting with daughter. Pt was able to display increased functional mobility performance during therapy today. Pt was able to assist with sequencing of BLEs toward the side of bed in preparation for supine<>sit transfer (still max assist for performance of transfer). Pt able to perform sit <>stand with min assist today demonstrating increased functional strength of BLEs. Pt's gait is still labored and disjointed, has a hard time initiating steps and tends to take a shorter step with the LLE due to inability to properly weight shift toward the R due to pain. PT provided cue for pt to attempt to walk without stopping for 10 ft on the way back to her bed; pt was able to demonstrate faster and more fluid gait with this instruction. Pt requires further skilled PT services in order to increase her functional LE strength and overall mobility.   Follow Up Recommendations  SNF     Equipment Recommendations  Rolling walker with 5" wheels    Recommendations for Other Services       Precautions / Restrictions Precautions Precautions: Fall Restrictions Weight Bearing Restrictions: Yes RLE Weight Bearing: Weight bearing as tolerated     Mobility  Bed Mobility Overal bed mobility: Needs Assistance Bed Mobility: Sit to Supine     Supine to sit: Max assist     General bed mobility comments: pt able to assist more with sequencing of LEs toward the side of the bed today  Transfers Overall transfer level: Needs assistance Equipment used: Rolling walker (2 wheeled) Transfers: Sit to/from Stand Sit to Stand: Min assist         General transfer comment: Pt displayed increased performance with sit to stand transfer today; demonstrated increased functional strength  Ambulation/Gait Ambulation/Gait assistance: Mod assist Ambulation Distance (Feet): 20 Feet Assistive device: Rolling walker (2 wheeled) Gait Pattern/deviations: Step-to pattern;Step-through pattern;Decreased step length - left;Decreased weight shift to right;Decreased stride length (step to wtih L, step through w/ R) Gait velocity: decreased Gait velocity interpretation: <1.8 ft/sec, indicative of risk for recurrent falls General Gait Details: PT provided cue to not stop walking in order to increase speed and fuiditiy of gait; pt responded well to this instruction and was able to ambulate with a more normalized gait pattern.    Stairs            Wheelchair Mobility    Modified Rankin (Stroke Patients Only)       Balance Overall balance assessment: Needs assistance Sitting-balance support: Bilateral upper extremity supported;Feet supported;No upper extremity supported Sitting balance-Leahy Scale: Fair Sitting balance - Comments: intermittently able to maintain stting balance without BUE support for a few seconds   Standing balance support: Bilateral upper extremity supported;During functional activity Standing balance-Leahy Scale: Poor  Cognition Arousal/Alertness: Awake/alert Behavior During Therapy: WFL for tasks assessed/performed Overall Cognitive Status: History of cognitive impairments - at baseline                       Exercises Total Joint Exercises Ankle Circles/Pumps: AROM;10 reps;Both;Supine Hip ABduction/ADduction: AAROM;Both;10 reps;Supine Knee Flexion: AAROM;Both;10 reps;Supine    General Comments        Pertinent Vitals/Pain Pain Assessment:  (pt did not rate pain)    Home Living                      Prior Function            PT Goals (current goals can now be found in the care plan section) Acute Rehab PT Goals Patient Stated Goal: to go to SNF PT Goal Formulation: With patient/family Time For Goal Achievement: 10/01/15 Potential to Achieve Goals: Good Progress towards PT goals: Progressing toward goals    Frequency  BID    PT Plan Current plan remains appropriate    Co-evaluation             End of Session Equipment Utilized During Treatment: Gait belt Activity Tolerance: Patient tolerated treatment well Patient left: in bed;with family/visitor present;with call bell/phone within reach;with bed alarm set     Time: 4098-1191 PT Time Calculation (min) (ACUTE ONLY): 23 min  Charges:                       G CodesGeorgina Peer 09/19/2015, 3:26 PM

## 2015-09-19 NOTE — Progress Notes (Signed)
Patient is medically stable for D/C to [facility] today. Per Kim admissions coordinator at [facility] patient is going to room 217. RN will call report at [phone] and arrange EMS for transport. Clinical Education officer, museum (CSW) prepared D/C packet and sent D/C Summary to [facility] via carefinder. Patient's daughter Neoma Laming met with Maudie Mercury yesterday and completed admissions paper work. Daughter is at bedside and aware of above. Please reconsult if future social work needs arise. CSW signing off.   Blima Rich, [place] [phone]

## 2015-09-20 LAB — URINALYSIS COMPLETE WITH MICROSCOPIC (ARMC ONLY)
BILIRUBIN URINE: NEGATIVE
Bacteria, UA: NONE SEEN
Glucose, UA: NEGATIVE mg/dL
HGB URINE DIPSTICK: NEGATIVE
Ketones, ur: NEGATIVE mg/dL
LEUKOCYTES UA: NEGATIVE
Nitrite: NEGATIVE
PH: 5 (ref 5.0–8.0)
Protein, ur: NEGATIVE mg/dL
Specific Gravity, Urine: 1.021 (ref 1.005–1.030)

## 2015-09-20 MED ORDER — DIGOXIN 125 MCG PO TABS: 0.1250 mg | ORAL_TABLET | Freq: Every day | ORAL | Status: DC

## 2015-09-20 MED ORDER — DIGOXIN 0.25 MG/ML IJ SOLN
0.2500 mg | Freq: Once | INTRAMUSCULAR | Status: AC
  Administered 2015-09-20: 0.25 mg via INTRAVENOUS
  Filled 2015-09-20: qty 1

## 2015-09-20 MED ORDER — DILTIAZEM HCL ER COATED BEADS 120 MG PO CP24
120.0000 mg | ORAL_CAPSULE | Freq: Every day | ORAL | Status: DC
  Administered 2015-09-20: 120 mg via ORAL
  Filled 2015-09-20 (×2): qty 1

## 2015-09-20 MED ORDER — DILTIAZEM HCL ER COATED BEADS 120 MG PO CP24: 120.0000 mg | ORAL_CAPSULE | Freq: Every day | ORAL | Status: DC

## 2015-09-20 MED ORDER — ACETAMINOPHEN 500 MG PO TABS
1000.0000 mg | ORAL_TABLET | Freq: Four times a day (QID) | ORAL | Status: DC
  Administered 2015-09-20 – 2015-09-21 (×3): 1000 mg via ORAL
  Filled 2015-09-20 (×3): qty 2

## 2015-09-20 MED ORDER — ENSURE ENLIVE PO LIQD: 237.0000 mL | Freq: Two times a day (BID) | ORAL | Status: DC

## 2015-09-20 NOTE — Progress Notes (Signed)
Discharge was cancelled yesterday. Per Kim admissions coordinator at [facility] the room is still available for today or over the weekend. Clinical Social Worker (CSW) met with patient's daughter Lydia [name] and made her aware of above. Per daughter MD said that we should know around lunch time today if patient will D/C today or over the weekend. CSW will continue to follow and assist as needed.   Blima Rich, [place] [phone]

## 2015-09-20 NOTE — Progress Notes (Signed)
PT Cancellation Note  Patient Details Name: Lydia [name] MRN: [id number] DOB: 08/18/1928   Cancelled Treatment:    Reason Eval/Treat Not Completed: Medical issues which prohibited therapy (Per RN, pt not fit to be seen due to high resting HR )   Georgina Peeraniel Alizah Sills,SPT 09/20/2015, 12:57 PM

## 2015-09-20 NOTE — Progress Notes (Signed)
  Subjective:  Patient is sleeping comfortably in bed. Her daughters at the bedside. Patient is not currently having any discomfort. She has a heart rate of 61 and a systolic blood pressure in the 90s. Patient is now being discharged so her heart rate can be monitored.  Objective:   VITALS:   Filed Vitals:   09/20/15 1128 09/20/15 1143 09/20/15 1343 09/20/15 1511  BP: 101/68 111/71 99/58 90/47   Pulse: 126 119 117 61  Temp:    98.3 F (36.8 C)  TempSrc:    Oral  Resp:    18  Height:      Weight:      SpO2:    93%    PHYSICAL EXAM:  Right lower extremity exam:  Incision: scant drainage No cellulitis present Compartment soft  LABS  Results for orders placed or performed during the hospital encounter of 09/15/15 (from the past 24 hour(s))  Urinalysis complete, with microscopic ([facility] only)     Status: Abnormal   Collection Time: 09/20/15 11:22 AM  Result Value Ref Range   Color, Urine AMBER (A) YELLOW   APPearance CLEAR (A) CLEAR   Glucose, UA NEGATIVE NEGATIVE mg/dL   Bilirubin Urine NEGATIVE NEGATIVE   Ketones, ur NEGATIVE NEGATIVE mg/dL   Specific Gravity, Urine 1.021 1.005 - 1.030   Hgb urine dipstick NEGATIVE NEGATIVE   pH 5.0 5.0 - 8.0   Protein, ur NEGATIVE NEGATIVE mg/dL   Nitrite NEGATIVE NEGATIVE   Leukocytes, UA NEGATIVE NEGATIVE   RBC / HPF 0-5 0 - 5 RBC/hpf   WBC, UA 0-5 0 - 5 WBC/hpf   Bacteria, UA NONE SEEN NONE SEEN   Squamous Epithelial / LPF 0-5 (A) NONE SEEN   Mucous PRESENT     No results found.  Assessment/Plan: 4 Days Post-Op   Active Problems:   Hip fracture, right (HCC)   Atrial fibrillation (HCC)   Hip fracture (HCC)   Pressure ulcer  Patient stable from an orthopedic standpoint. I'll change her dressings tomorrow. Patient will have Tylenol changed to around-the-clock instead a when necessary.    Juanell FairlyKRASINSKI, Knoah Nedeau , MD 09/20/2015, 5:13 PM

## 2015-09-20 NOTE — Consult Note (Signed)
ANTICOAGULATION CONSULT NOTE - Follow up  Pharmacy Consult for eliquis Indication: atrial fibrillation  No Known Allergies  Patient Measurements: Height: 5\' 5"  (165.1 cm) Weight: 148 lb 9.6 oz (67.405 kg) IBW/kg (Calculated) : 57 Heparin Dosing Weight:   Vital Signs: Temp: 98.3 F (36.8 C) (10/21 0719) Temp Source: Oral (10/21 0719) BP: 105/69 mmHg (10/21 0719) Pulse Rate: 139 (10/21 0719)  Labs:  Recent Labs  09/18/15 0557 09/19/15 0545  HGB 11.1* 10.4*  HCT 33.8* 32.1*  PLT 121* 139*  CREATININE 1.07* 1.07*    Estimated Creatinine Clearance: 34.6 mL/min (by C-G formula based on Cr of 1.07).   Medical History: Past Medical History  Diagnosis Date  . Hypertension   . A-fib (HCC)     Medications:  Scheduled:  . apixaban  5 mg Oral BID  . calcitonin (salmon)  1 spray Alternating Nares Daily  . citalopram  10 mg Oral Daily  . digoxin  0.25 mg Intravenous Once  . docusate sodium  100 mg Oral BID  . ferrous sulfate  325 mg Oral TID PC  . furosemide  40 mg Oral Daily  . metoprolol tartrate  150 mg Oral BID  . senna  1 tablet Oral BID    Assessment: Pt is a 81 year old female post op hip surgery. Patient takes warfarin at home for stroke prevention due to afib. Pt was given IV vit K prior to surgery. This will present a challenge in returning pt INR to therapeutic levels quickly. Dr. Renae GlossWieting would like to switch pt over to eliquis. Discussed this with family and they are agreeable. Spoke with Dr. Martha ClanKrasinski, who agrees with switch and suggests waiting until 24 post op to start.   Monitor platelets by anticoagulation protocol: Yes   Plan:  Patient on Apixaban 5mg  BID for Atrial Fibrillation/DVT prophylaxis.   Pharmacy to continue to monitor.  Bari MantisKristin Caine Barfield PharmD Clinical Pharmacist 09/20/2015 8:05 AM

## 2015-09-20 NOTE — Progress Notes (Signed)
Pts HR 139 MD notified. Per MD give scheduled morning dose of  metoprolol 150 mg. (see MAR). Will continue to monitor.

## 2015-09-20 NOTE — Progress Notes (Signed)
MD Wieting notified about pts HR dropping as low as 48, non sustained. No new orders given. Will continue to monitor.

## 2015-09-20 NOTE — Progress Notes (Signed)
Initial Nutrition Assessment   INTERVENTION:   Meals and Snacks: Cater to patient preferences Medical Food Supplement Therapy: will recommend Ensure Enlive po BID, each supplement provides 350 kcal and 20 grams of protein and will send Magic Cup on lunch and dinner trays.   NUTRITION DIAGNOSIS:   Increased nutrient needs related to acute illness as evidenced by estimated needs.  GOAL:   Patient will meet greater than or equal to 90% of their needs  MONITOR:    (Energy Intake, Electrolyte and renal Profile, Skin, Anthropometrics)  REASON FOR ASSESSMENT:   LOS    ASSESSMENT:   Pt admitted with right hip fracture, POD4 surgical repair. Pt with h/o dementia. Pt discharge cancelled per MD note secondary to elevated resting heart rate. Pt confused on visit.  Past Medical History  Diagnosis Date  . Hypertension   . A-fib [facility](HCC)     Diet Order:  Diet regular Room service appropriate?: Yes; Fluid consistency:: Thin   Current Nutrition: Per family member pt has been eating 'what she can tolerate,' mainly yogurt, sherbet, and vanilla pudding and that is what has been ordered on meal trays. Documented po intake 64% of meals on average since advancing to solid foods.   Food/Nutrition-Related History: Per MST no decrease in appetite PTA. Per family pt has not had supplements in the past.   Scheduled Medications:  . apixaban  5 mg Oral BID  . calcitonin (salmon)  1 spray Alternating Nares Daily  . citalopram  10 mg Oral Daily  . [START ON 09/21/2015] digoxin  0.125 mg Oral Daily  . diltiazem  120 mg Oral Daily  . docusate sodium  100 mg Oral BID  . feeding supplement (ENSURE ENLIVE)  237 mL Oral BID BM  . ferrous sulfate  325 mg Oral TID PC  . furosemide  40 mg Oral Daily  . metoprolol tartrate  150 mg Oral BID  . senna  1 tablet Oral BID    Electrolyte/Renal Profile and Glucose Profile:   Recent Labs Lab 09/17/15 0606 09/18/15 0557 09/19/15 0545  NA 144 145 144  K  4.3 4.0 4.3  CL 112* 111 108  CO2 28 29 31   BUN 32* 36* 36*  CREATININE 1.07* 1.07* 1.07*  CALCIUM 8.7* 8.7* 8.4*  GLUCOSE 141* 125* 118*   Protein Profile:  Recent Labs Lab 09/15/15 1038  ALBUMIN 3.6    Gastrointestinal Profile: Last BM:  09/20/2015   Nutrition-Focused Physical Exam Findings:  Unable to complete Nutrition-Focused physical exam at this time.     Weight Change: Per [facility] pt weight loss of 12% weight loss in 1.5 years    Skin:   Stage I buttock pressure ulcer   Height:   Ht Readings from Last 1 Encounters:  09/15/15 5\' 5"  (1.651 m)    Weight:   Wt Readings from Last 1 Encounters:  09/16/15 148 lb 9.6 oz (67.405 kg)    Wt Readings from Last 10 Encounters:  09/16/15 148 lb 9.6 oz (67.405 kg)  12/05/13 168 lb (76.204 kg)  11/09/13 155 lb (70.308 kg)  11/01/13 157 lb (71.215 kg)  10/10/13 151 lb (68.493 kg)     BMI:  Body mass index is 24.73 kg/(m^2).  Estimated Nutritional Needs:   Kcal:  BEE: 1116kcals, TEE: (IF 1.2-1.4)(AF 1.2) [id number]  Protein:  67-80g proteing (1.0-1.2g/kg)   Fluid:  1675-214900mL of fluid (25-7830mL/kg)  EDUCATION NEEDS:   Education needs no appropriate at this time   MODERATE Care Level  Doylene Splinter  Melina Modena, RD, LDN Pager [phone]

## 2015-09-20 NOTE — Care Management Important Message (Signed)
Important Message  Patient Details  Name: Lydia [name] MRN: [id number] Date of Birth: 12/22/1927   Medicare Important Message Given:  Yes-fourth notification given    Olegario MessierKathy A Allmond 09/20/2015, 10:17 AM

## 2015-09-20 NOTE — Progress Notes (Signed)
Patient ID: SHAWNY BORKOWSKI, female   DOB: 1928/06/15, 81 y.o.   MRN: [id number] [facility] PROGRESS NOTE  PCP: Clydie Braun, MD  HPI/Subjective: Patient was picking in the air at things with dementia and rambling on.  Objective: Filed Vitals:   09/20/15 1143  BP: 111/71  Pulse: 119  Temp:   Resp:     Filed Weights   09/15/15 0913 09/15/15 1200 09/16/15 0552  Weight: 66.543 kg (146 lb 11.2 oz) 66.996 kg (147 lb 11.2 oz) 67.405 kg (148 lb 9.6 oz)    ROS: Review of Systems  Unable to perform ROS  unable to obtain with dementia Exam: Physical Exam  HENT:  Nose: No mucosal edema.  Mouth/Throat: No oropharyngeal exudate or posterior oropharyngeal edema.  Eyes: Conjunctivae and lids are normal. Pupils are equal, round, and reactive to light.  Neck: No JVD present. Carotid bruit is not present. No edema present. No thyroid mass and no thyromegaly present.  Cardiovascular: S1 normal and S2 normal.  An irregularly irregular rhythm present. Tachycardia present.  Exam reveals no gallop.   Murmur heard.  Systolic murmur is present with a grade of 2/6  Pulses:      Dorsalis pedis pulses are 2+ on the right side, and 2+ on the left side.  Respiratory: No respiratory distress. She has no wheezes. She has no rhonchi. She has no rales.  GI: Soft. Bowel sounds are normal. There is no tenderness.  Musculoskeletal:       Right ankle: She exhibits swelling.       Left ankle: She exhibits swelling.  Lymphadenopathy:    She has no cervical adenopathy.  Neurological: She is alert.  Able to move extremities  Skin: Skin is warm. Nails show no clubbing.  Skin tear left leg  Psychiatric: She has a normal mood and affect.    Data Reviewed: Basic Metabolic Panel:  Recent Labs Lab 09/15/15 1036 09/16/15 0258 09/17/15 0606 09/18/15 0557 09/19/15 0545  NA 140 143 144 145 144  K 4.5 4.8 4.3 4.0 4.3  CL 108 109 112* 111 108  CO2 GLUCOSE 125* 145* 141*  125* 118*  BUN 31* 32* 32* 36* 36*  CREATININE 1.17* 1.14* 1.07* 1.07* 1.07*  CALCIUM 8.9 9.1 8.7* 8.7* 8.4*   Liver Function Tests:  Recent Labs Lab 09/15/15 1038  ALBUMIN 3.6   CBC:  Recent Labs Lab 09/15/15 1036 09/16/15 0258 09/17/15 0606 09/18/15 0557 09/19/15 0545  WBC 10.0 8.2 9.3 10.4 8.0  NEUTROABS 8.6* 6.0  --   --   --   HGB 12.2 12.0 11.2* 11.1* 10.4*  HCT 36.6 36.0 33.2* 33.8* 32.1*  MCV 93.3 93.9 93.9 94.2 94.5  PLT 140* 121* 115* 121* 139*     Recent Results (from the past 240 hour(s))  Urine culture     Status: None   Collection Time: 09/15/15 10:37 AM  Result Value Ref Range Status   Specimen Description URINE, RANDOM  Final   Special Requests NONE  Final   Culture NO GROWTH 1 DAY  Final   Report Status 09/16/2015 FINAL  Final  Surgical pcr screen     Status: None   Collection Time: 09/15/15  3:23 PM  Result Value Ref Range Status   MRSA, PCR NEGATIVE NEGATIVE Final   Staphylococcus aureus NEGATIVE NEGATIVE Final    Comment:        The Xpert SA Assay (FDA approved for NASAL specimens in patients  over 79 years of age), is one component of a comprehensive surveillance program.  Test performance has been validated by [facility] for patients greater than or equal to 79 year old. It is not intended to diagnose infection nor to guide or monitor treatment.      Scheduled Meds: . apixaban  5 mg Oral BID  . calcitonin (salmon)  1 spray Alternating Nares Daily  . citalopram  10 mg Oral Daily  . [START ON 09/21/2015] digoxin  0.125 mg Oral Daily  . diltiazem  120 mg Oral Daily  . docusate sodium  100 mg Oral BID  . ferrous sulfate  325 mg Oral TID PC  . furosemide  40 mg Oral Daily  . metoprolol tartrate  150 mg Oral BID  . senna  1 tablet Oral BID    Assessment/Plan:  1. Rapid atrial fibrillation- having difficulty controlling heart rate the last 2 days.  I will cancel discharge today. Continue high-dose metoprolol 150 mg twice a day.  Add Cardizem CD 120 mg daily. I gave 2 doses of IV digoxin. Hopefully we can get rid of the digoxin prior to doing in the hospital. Eliquis for anticoagulation. 2. Relative hypotension- more with fast heart rate 3. Difficulty urinating straight catheter every 8 hours as needed 4. Right hip fracture closed initial encounter status postoperative repair. Out to rehabilitation soon 5. Acute respiratory failure with hypoxia- improved 6. Dementia- rehabilitation will be difficult.   Code Status:     Code Status Orders        Start     Ordered   09/16/15 1608  Do not attempt resuscitation (DNR)   Continuous    Question Answer Comment  In the event of cardiac or respiratory ARREST Do not call a "code blue"   In the event of cardiac or respiratory ARREST Do not perform Intubation, CPR, defibrillation or ACLS   In the event of cardiac or respiratory ARREST Use medication by any route, position, wound care, and other measures to relive pain and suffering. May use oxygen, suction and manual treatment of airway obstruction as needed for comfort.      09/16/15 1607    Advance Directive Documentation        Most Recent Value   Type of Advance Directive  Out of facility DNR (pink MOST or yellow form)   Pre-existing out of facility DNR order (yellow form or pink MOST form)     "MOST" Form in Place?       Family Communication: family at bedside Disposition Plan: hopefully rehabilitation tomorrow  Consultants:  Cardiology  Procedures:  Right hip repair  Time spent: 40 minutes  Alford HighlandWIETING, Dilpreet Faires  [facility]

## 2015-09-20 NOTE — Progress Notes (Signed)
Pts HR 117, BP 99/58. Per MD give cardizem. Will continue to monitor.

## 2015-09-20 NOTE — Consult Note (Signed)
[facility] Cardiology Consultation Note  Patient ID: CHEVETTE FEE, MRN: [id number], DOB/AGE: [birth year] 81 y.o. Admit date: 09/15/2015   Date of Consult: 09/20/2015 Primary Physician: Clydie Braun, MD Primary Cardiologist: None  Chief Complaint:  Chief Complaint  Patient presents with  . Fall   Reason for Consult: atrial fibrillation with rapid ventricular rate  HPI: 81 y.o. female with known chronic atrial fibrillation on metoprolol for heart rate control as well as anticoagulation for further risk reduction in stroke who had a fall and hip fracture for which the patient has undergone surgery. The patient has been recovering from this but has had some significant episodes of atrial fibrillation with rapid ventricular rate. Multiple dosages of medications have been used and the patient has had waxing and waning of heart rate control. Blood pressure has been somewhat low with certain types of medications. Lately a combination of beta blocker and calcium channel blocker and digoxin have been used. Over the last several hours her heart rate now is in the 40-50 beat per minute range and much more controlled. Therefore we will have to make further adjustments. She has been on anticoagulation without any bleeding complications  Past Medical History  Diagnosis Date  . Hypertension   . A-fib [facility])       Surgical History:  Past Surgical History  Procedure Laterality Date  . Appendectomy    . Abdominal hysterectomy  1964  . Hernia repair Right 10/19/13    inguinal  . Femur im nail Right 09/16/2015    Procedure: INTRAMEDULLARY (IM) NAIL FEMORAL;  Surgeon: Juanell Fairly, MD;  Location: [facility] ORS;  Service: Orthopedics;  Laterality: Right;     Home Meds: Prior to Admission medications   Medication Sig Start Date End Date Taking? Authorizing Provider  acetaminophen (TYLENOL) 325 MG tablet Take 650 mg by mouth every 4 (four) hours as needed for mild pain or moderate pain.   Yes  Historical Provider, MD  barrier cream (NON-SPECIFIED) CREA Apply 1 application topically 2 (two) times daily. Apply to buttocks.   Yes Historical Provider, MD  calcitonin, salmon, (MIACALCIN/FORTICAL) 200 UNIT/ACT nasal spray Place 1 spray into alternate nostrils daily. 03/08/15  Yes Historical Provider, MD  citalopram (CELEXA) 10 MG tablet Take 10 mg by mouth daily. 03/01/15 02/29/16 Yes Historical Provider, MD  Fluticasone-Salmeterol (ADVAIR DISKUS) 100-50 MCG/DOSE AEPB Inhale 1 puff into the lungs 2 (two) times daily.   Yes Historical Provider, MD  lisinopril (PRINIVIL,ZESTRIL) 2.5 MG tablet Take 2.5 mg by mouth every morning. 03/28/15  Yes Historical Provider, MD  metoprolol (LOPRESSOR) 100 MG tablet Take 150 mg by mouth 2 (two) times daily.   Yes Historical Provider, MD  polyethylene glycol powder (GLYCOLAX/MIRALAX) powder Take 17 g by mouth daily as needed. For constipation. Mis with 4-8oz of fluid.   Yes Historical Provider, MD  triamcinolone (NASACORT) 55 MCG/ACT AERO nasal inhaler Place 2 sprays into the nose daily. 06/14/15 06/13/16 Yes Historical Provider, MD  warfarin (COUMADIN) 2.5 MG tablet Take 2.5 mg by mouth daily.   Yes Historical Provider, MD  apixaban (ELIQUIS) 5 MG TABS tablet Take 1 tablet (5 mg total) by mouth 2 (two) times daily. 09/19/15   Alford Highland, MD  diltiazem (CARDIZEM CD) 120 MG 24 hr capsule Take 1 capsule (120 mg total) by mouth daily. At 1pm 09/20/15   Alford Highland, MD  feeding supplement, ENSURE ENLIVE, (ENSURE ENLIVE) LIQD Take 237 mLs by mouth 2 (two) times daily between meals. 09/20/15   Richard Renae Gloss,  MD  ferrous sulfate 325 (65 FE) MG tablet Take 1 tablet (325 mg total) by mouth daily with breakfast. 09/19/15   Alford Highlandichard Wieting, MD  furosemide (LASIX) 40 MG tablet Take 0.5 tablets (20 mg total) by mouth daily. 09/19/15   Alford Highlandichard Wieting, MD  Metoprolol Tartrate 75 MG TABS Take 150 mg by mouth 2 (two) times daily. 09/19/15   Alford Highlandichard Wieting, MD  potassium  chloride SA (K-DUR,KLOR-CON) 20 MEQ tablet Take 1 tablet (20 mEq total) by mouth daily. 09/19/15   Alford Highlandichard Wieting, MD  traMADol (ULTRAM) 50 MG tablet Take 0.5 tablets (25 mg total) by mouth every 8 (eight) hours as needed. Take at 8am, 2pm, and 8pm. 09/19/15   Alford Highlandichard Wieting, MD    Inpatient Medications:  . apixaban  5 mg Oral BID  . calcitonin (salmon)  1 spray Alternating Nares Daily  . citalopram  10 mg Oral Daily  . diltiazem  120 mg Oral Daily  . docusate sodium  100 mg Oral BID  . feeding supplement (ENSURE ENLIVE)  237 mL Oral BID BM  . ferrous sulfate  325 mg Oral TID PC  . furosemide  40 mg Oral Daily  . metoprolol tartrate  150 mg Oral BID  . senna  1 tablet Oral BID      Allergies: No Known Allergies  Social History   Social History  . Marital Status: Married    Spouse Name: N/A  . Number of Children: N/A  . Years of Education: N/A   Occupational History  . Not on file.   Social History Main Topics  . Smoking status: Never Smoker   . Smokeless tobacco: Never Used  . Alcohol Use: No  . Drug Use: No  . Sexual Activity: Not on file   Other Topics Concern  . Not on file   Social History Narrative     Family History  Problem Relation Age of Onset  . Cervical cancer Mother   . Lung cancer Brother      Review of Systems Positive for Unable to assess due to dementia  Labs: No results for input(s): CKTOTAL, CKMB, TROPONINI in the last 72 hours. Lab Results  Component Value Date   WBC 8.0 09/19/2015   HGB 10.4* 09/19/2015   HCT 32.1* 09/19/2015   MCV 94.5 09/19/2015   PLT 139* 09/19/2015    Recent Labs Lab 09/19/15 0545  NA 144  K 4.3  CL 108  CO2 31  BUN 36*  CREATININE 1.07*  CALCIUM 8.4*  GLUCOSE 118*   No results found for: CHOL, HDL, LDLCALC, TRIG No results found for: DDIMER  Radiology/Studies:  Dg Chest 1 View  09/15/2015  CLINICAL DATA:  Right hip fracture.  Preop respiratory exam. EXAM: CHEST 1 VIEW COMPARISON:  02/19/2015  FINDINGS: Mild to moderate cardiomegaly stable. No evidence of pulmonary edema or other infiltrate. No evidence of pleural effusion. IMPRESSION: Cardiomegaly.  No active lung disease. Electronically Signed   By: Myles RosenthalJohn  Stahl M.D.   On: 09/15/2015 10:11   Dg C-arm 61-120 Min  09/16/2015  CLINICAL DATA:  Internal fixation of RIGHT femur fracture EXAM: DG C-ARM 61-120 MIN; RIGHT FEMUR 2 VIEWS COMPARISON:  09/15/2015 FINDINGS: Intra medullary nail fixation of the intertrochanteric fracture on the RIGHT. Single distal locking screw noted. Adjacent to the tip of the distal interlocking cortical screw, there is a short approximately 5 mm fragment of cortical elevation. IMPRESSION: Intra medullary nail fixation of intertrochanteric RIGHT femur fracture Electronically Signed   By:  Genevive Bi M.D.   On: 09/16/2015 14:38   Dg Hip Unilat With Pelvis 2-3 Views Right  09/15/2015  CLINICAL DATA:  81 year old female with history of trauma from a fall while walking complaining of right-sided hip pain. EXAM: DG HIP (WITH OR WITHOUT PELVIS) 2-3V RIGHT COMPARISON:  No priors. FINDINGS: No acute displaced fracture of the bony pelvic ring. There is a comminuted intertrochanteric fracture of the right femur with approximately 45 degrees of varus angulation, as well as some proximal migration of the fracture and lateral displacement. Right femoral head remains properly located. IMPRESSION: 1. Comminuted intertrochanteric fracture of the right femur with proximal migration and lateral displacement as well as approximately 45 degrees of varus angulation. Electronically Signed   By: Trudie Reed M.D.   On: 09/15/2015 10:09   Dg Femur, Min 2 Views Right  09/16/2015  CLINICAL DATA:  Internal fixation of RIGHT femur fracture EXAM: DG C-ARM 61-120 MIN; RIGHT FEMUR 2 VIEWS COMPARISON:  09/15/2015 FINDINGS: Intra medullary nail fixation of the intertrochanteric fracture on the RIGHT. Single distal locking screw noted. Adjacent  to the tip of the distal interlocking cortical screw, there is a short approximately 5 mm fragment of cortical elevation. IMPRESSION: Intra medullary nail fixation of intertrochanteric RIGHT femur fracture Electronically Signed   By: Genevive Bi M.D.   On: 09/16/2015 14:38   Dg Femur Port, Min 2 Views Right  09/16/2015  CLINICAL DATA:  Intertrochanteric fracture of the proximal right femur. EXAM: RIGHT FEMUR PORTABLE 1 VIEW COMPARISON:  Radiographs dated 09/15/2015 FINDINGS: The patient has undergone open reduction and internal fixation of the comminuted intertrochanteric fracture. There is an intramedullary nail and lag screw across the comminuted fracture of the proximal femur. Alignment and position of the fracture fragments is much improved. Lesser trochanter fragment remains displaced. IMPRESSION: Open reduction and internal fixation of comminuted proximal femur fracture. Electronically Signed   By: Francene Boyers M.D.   On: 09/16/2015 15:45    EKG: Atrial fibrillation with rapid ventricular rate and nonspecific ST and T-wave changes  Weights: Filed Weights   09/15/15 0913 09/15/15 1200 09/16/15 0552  Weight: 146 lb 11.2 oz (66.543 kg) 147 lb 11.2 oz (66.996 kg) 148 lb 9.6 oz (67.405 kg)     Physical Exam: Blood pressure 90/47, pulse 61, temperature 98.3 F (36.8 C), temperature source Oral, resp. rate 18, height 5\' 5"  (1.651 m), weight 148 lb 9.6 oz (67.405 kg), SpO2 93 %. Body mass index is 24.73 kg/(m^2). General: Well developed, well nourished, in no acute distress. Head eyes ears nose throat: Normocephalic, atraumatic, sclera non-icteric, no xanthomas, nares are without discharge. No apparent thyromegaly and/or mass  Lungs: Normal respiratory effort. Few wheezes, no rales, no rhonchi.  Heart: Irregular with normal S1 S2. no murmur gallop, no rub, PMI is normal size and placement, carotid upstroke normal without bruit, jugular venous pressure is normal Abdomen: Soft, non-tender,  non-distended with normoactive bowel sounds. No hepatomegaly. No rebound/guarding. No obvious abdominal masses. Abdominal aorta is normal size without bruit Extremities: No edema. no cyanosis, no clubbing, no ulcers  Peripheral : 2+ bilateral upper extremity pulses, 2+ bilateral femoral pulses, 2+ bilateral dorsal pedal pulse Neuro: Alert and oriented. No facial asymmetry. No focal deficit. Moves all extremities spontaneously. Musculoskeletal: Normal muscle tone with  kyphosis     Assessment: 81 year old female with atrial fibrillation and chronic nonvalvular in nature with rapid ventricular rate due to recent surgery needing adjustments of medication management  Plan: 1. Continue diltiazem CD  at 120 mg each day which appears to be working fairly well in addition to metoprolol 2. Decrease metoprolol dose to 100 mg 3. Discontinuation of digoxin at this time due to concerns of end-stage and renal insufficiency which may decrease metabolism 4. Further adjustments tomorrow as necessary one patient's ambulating 5. Continue anticoagulation with current medical regimen without change 6. Okay for discharge to outside facility for rehabilitation after above  Signed, Lamar Blinks M.D. [facility] Cardiology 09/20/2015, 4:33 PM

## 2015-09-20 NOTE — Progress Notes (Signed)
Occupational Therapy Treatment Patient Details Name: Lydia [name] MRN: [id number] DOB: 08-Feb-1928 Today's Date: 09/20/2015    History of present illness Lydia [name] is a 81 y.o. female who complains of right hip pain status post fall. Patient has dementia and is unable to provide accurate history regarding the mechanism of injury. Her daughters are at the bedside. They report the patient is walking without her walker which she uses normally at baseline for assistance with ambulation. Patient has a history of atrial fibrillation and is on Coumadin at baseline. She is postop day #1 status post intramedullary fixation for right intertrochanteric hip fracture.   OT comments  met with patient and her daughter Hilda Blades to discuss use of adaptive utensils, colored plate and built up handles for cup with lid to help increase self feeding skills.  Gave her an Health and safety inspector and reviewed contents and rec to help increase independence and safety for ADLs. Her dementia appears to have worsened since surgery per daughter's report.  She was about to receive a new medication to help regulate HR and may go to SNF today or tomorrow.  Follow Up Recommendations  SNF    Equipment Recommendations       Recommendations for Other Services      Precautions / Restrictions         Mobility Bed Mobility                  Transfers                      Balance                                   ADL                                         General ADL Comments: met with patient and her daughter Hilda Blades to discuss use of adaptive utensils, colored plate and built up handles for cup with lid to help increase self feeding skills.  Her dementia appears to have worsened since surgery per daughter's report.      Vision                     Perception     Praxis      Cognition                             Extremity/Trunk  Assessment               Exercises     Shoulder Instructions       General Comments      Pertinent Vitals/ Pain          Home Living                                          Prior Functioning/Environment              Frequency Min 1X/week     Progress Toward Goals  OT Goals(current goals can now be found in the care plan section)  Progress towards OT goals: Not progressing toward goals - comment (pt  is very limited by lethargy and receiving new med for HR )  Acute Rehab OT Goals Patient Stated Goal: to go to SNF OT Goal Formulation: With patient Time For Goal Achievement: 10/01/15 Potential to Achieve Goals: [place] Discharge plan remains appropriate    Co-evaluation                 End of Session Equipment Utilized During Treatment:  (gave Hilda Blades an adaptive equip catalog for ADLs)   Activity Tolerance Patient limited by lethargy;Patient limited by pain   Patient Left in bed;with call bell/phone within reach;with bed alarm set;with family/visitor present   Nurse Communication          Time: 1100-1115 OT Time Calculation (min): 15 min  Charges: OT General Charges $OT Visit: 1 Procedure OT Treatments $Self Care/Home Management : 8-22 mins  Wofford,Susan 09/20/2015, 12:23 PM    Chrys Racer, OTR/L ascom [phone]

## 2015-09-20 NOTE — Progress Notes (Signed)
MD Wieting notified of Pts BP 86/54 HR 131. Per MD give Digoxin (see MAR). Will continue to monitor.

## 2015-09-20 NOTE — Progress Notes (Signed)
Pts HR 117, BP 99/58. Per MD give cardizem. Will continue to monitor.  

## 2015-09-20 NOTE — Progress Notes (Signed)
Per MD patient is not medically stable for D/C today. [facility]. [facility]. RN will call report at [phone]. Clinical Child psychotherapistocial Worker (CSW) [facility]. Patient's daughter Gavin PoundDeborah is at bedside and aware of above. CSW will continue to follow and assist as needed.   Jetta LoutBailey Morgan, LCSWA [phone]

## 2015-09-20 NOTE — Progress Notes (Signed)
PT Cancellation Note  Patient Details Name: Lydia [name] MRN: [id number] DOB: 05/17/1928   Cancelled Treatment:    Reason Eval/Treat Not Completed: Medical issues which prohibited therapy (Resting HR remains elevated (120s at rest), contraindicating therapy at this time. Noted that discharge cancelled for today; will continue therapy efforts next date as patient medically appropriate.)   Latoy Labriola H. Manson PasseyBrown, PT, DPT, NCS 09/20/2015, 2:02 PM [phone]

## 2015-09-20 NOTE — Progress Notes (Signed)
MD Gwen PoundsKowalski notified of pts HR getting in  the 50's. MD stated he is aware. Will continue to monitor.

## 2015-09-21 MED ORDER — METOPROLOL TARTRATE 100 MG PO TABS: 100.0000 mg | ORAL_TABLET | Freq: Two times a day (BID) | ORAL | Status: DC

## 2015-09-21 MED ORDER — METOPROLOL TARTRATE 100 MG PO TABS
100.0000 mg | ORAL_TABLET | Freq: Two times a day (BID) | ORAL | Status: DC
  Administered 2015-09-21: 100 mg via ORAL
  Filled 2015-09-21: qty 1

## 2015-09-21 MED ORDER — TRAMADOL HCL 50 MG PO TABS: 25.0000 mg | ORAL_TABLET | Freq: Three times a day (TID) | ORAL | Status: DC | PRN

## 2015-09-21 NOTE — Discharge Summary (Signed)
Give the cards [facility] - [place] at [facility]   PATIENT NAME: Lydia [name]    MR#:  [id number]  DATE OF BIRTH:  [birth year]  DATE OF ADMISSION:  09/15/2015 ADMITTING PHYSICIAN: Lydia Fairly, MD  DATE OF DISCHARGE: 09/21/2015  PRIMARY CARE PHYSICIAN: Lydia Braun, MD    ADMISSION DIAGNOSIS:  Hip fracture, right, closed, initial encounter (HCC) [S72.001A]  DISCHARGE DIAGNOSIS:  Active Problems:   Hip fracture, right (HCC)   Atrial fibrillation (HCC)   Hip fracture (HCC)   Pressure ulcer   SECONDARY DIAGNOSIS:   Past Medical History  Diagnosis Date  . Hypertension   . A-fib [facility])     HOSPITAL COURSE:   1. Right hip fracture closed requiring operative repair. Please see operative report by Dr. Martha [name]. Patient will be transferred out to rehab. 2. Rapid Atrial fibrillation and relative hypotension- patient on 150 mg of metoprolol twice a day. On 09/20/2015 I added Cardizem CD 120 mg daily in the afternoon. One dose of IV digoxin. Patients hr improved with adjustment medications, now on metoprolol  bid. Patient was on coumadin now switched to Eliquis for anticoagulation started postoperatively. 3. Acute respiratory failure with hypoxia-  Use prn oxygen likely due to severe TR and pulm htn 4. Difficulty with urination- straight catheter every 8 hours as needed for inability to urinate. 5. History of congestive heart failure- lungs sound clear. Low-dose Lasix and metoprolol. No signs of heart failure currently. 6. History of depression and dementia on Celexa 7. Stage I pressure ulcer on buttock- rotate patient every couple hours. Covered with a dressing.  DISCHARGE CONDITIONS:   Satisfactory  CONSULTS OBTAINED:  Treatment Team:  Lydia Fairly, MD Lydia Blinks, MD  DRUG ALLERGIES:  No Known Allergies  DISCHARGE MEDICATIONS:   Current Discharge Medication List    START taking these medications   Details  apixaban  (ELIQUIS) 5 MG TABS tablet Take 1 tablet (5 mg total) by mouth 2 (two) times daily. Qty: 60 tablet    diltiazem (CARDIZEM CD) 120 MG 24 hr capsule Take 1 capsule (120 mg total) by mouth daily. At 1pm    feeding supplement, ENSURE ENLIVE, (ENSURE ENLIVE) LIQD Take 237 mLs by mouth 2 (two) times daily between meals. Qty: 237 mL, Refills: 12    ferrous sulfate 325 (65 FE) MG tablet Take 1 tablet (325 mg total) by mouth daily with breakfast. Refills: 3      CONTINUE these medications which have CHANGED   Details  furosemide (LASIX) 40 MG tablet Take 0.5 tablets (20 mg total) by mouth daily. Qty: 30 tablet hold of sbp<106     metoprolol (LOPRESSOR) 100 MG tablet Take 1 tablet (100 mg total) by mouth 2 (two) times daily. Qty: 30 tablet, Refills: 0         potassium chloride SA (K-DUR,KLOR-CON) 20 MEQ tablet Take 1 tablet (20 mEq total) by mouth daily.    traMADol (ULTRAM) 50 MG tablet Take 0.5 tablets (25 mg total) by mouth every 8 (eight) hours as needed. Take at 8am, 2pm, and 8pm. Qty: 30 tablet, Refills: 0        CONTINUE these medications which have NOT CHANGED   Details  acetaminophen (TYLENOL) 325 MG tablet Take 650 mg by mouth every 4 (four) hours as needed for mild pain or moderate pain.    barrier cream (NON-SPECIFIED) CREA Apply 1 application topically 2 (two) times daily. Apply to buttocks.    calcitonin, salmon, (MIACALCIN/FORTICAL) 200 UNIT/ACT nasal  spray Place 1 spray into alternate nostrils daily.    citalopram (CELEXA) 10 MG tablet Take 10 mg by mouth daily.    Fluticasone-Salmeterol (ADVAIR DISKUS) 100-50 MCG/DOSE AEPB Inhale 1 puff into the lungs 2 (two) times daily.    polyethylene glycol powder (GLYCOLAX/MIRALAX) powder Take 17 g by mouth daily as needed. For constipation. Mis with 4-8oz of fluid.    triamcinolone (NASACORT) 55 MCG/ACT AERO nasal inhaler Place 2 sprays into the nose daily.      STOP taking these medications     lisinopril  (PRINIVIL,ZESTRIL) 2.5 MG tablet      warfarin (COUMADIN) 2.5 MG tablet          DISCHARGE INSTRUCTIONS:   Follow up with doctor at rehabilitation 1 day. Physical therapy at rehabilitation.  If you experience worsening of your admission symptoms, develop shortness of breath, life threatening emergency, suicidal or homicidal thoughts you must seek medical attention immediately by calling 911 or calling your MD immediately  if symptoms less severe.  You Must read complete instructions/literature along with all the possible adverse reactions/side effects for all the Medicines you take and that have been prescribed to you. Take any new Medicines after you have completely understood and accept all the possible adverse reactions/side effects.   Please note  You were cared for by a hospitalist during your hospital stay. If you have any questions about your discharge medications or the care you received while you were in the hospital after you are discharged, you can call the unit and asked to speak with the hospitalist on call if the hospitalist that took care of you is not available. Once you are discharged, your primary care physician will handle any further medical issues. Please note that NO REFILLS for any discharge medications will be authorized once you are discharged, as it is imperative that you return to your primary care physician (or establish a relationship with a primary care physician if you do not have one) for your aftercare needs so that they can reassess your need for medications and monitor your lab values.    Today   CHIEF COMPLAINT:   Chief Complaint  Patient presents with  . Fall    HISTORY OF PRESENT ILLNESS:  Lydia [name]  is a 81 y.o. female with a known history of dementia presented with fall and found to have a broken hip.   PHYSICAL EXAMINATION:  GENERAL:  81 y.o.-year-old patient lying in the bed with no acute distress.  EYES: Pupils equal, round,  reactive to light and accommodation. No scleral icterus. Extraocular muscles intact.  HEENT: Head atraumatic, normocephalic. Oropharynx and nasopharynx clear.  NECK:  Supple, no jugular venous distention. No thyroid enlargement, no tenderness.  LUNGS: Normal breath sounds bilaterally, no wheezing, rales,rhonchi or crepitation. No use of accessory muscles of respiration.  CARDIOVASCULAR: S1, S2 irregularly irregular tachycardic. 2 and a 6 systolic murmur, no rubs, or gallops.  ABDOMEN: Soft, non-tender, non-distended. Bowel sounds present. No organomegaly or mass.  EXTREMITIES: No pedal edema, cyanosis, or clubbing.  NEUROLOGIC: Cranial nerves II through XII are intact. Patient able to lift arms up in the air. Barely able to lift right leg up off the bed. Able to lift left leg up off the bed. Sensation intact. Gait not checked.  PSYCHIATRIC: The patient is alert.  SKIN: As per nursing staff, stage I decubiti buttock  DATA REVIEW:   CBC  Recent Labs Lab 09/19/15 0545  WBC 8.0  HGB 10.4*  HCT 32.1*  PLT 139*    Chemistries   Recent Labs Lab 09/19/15 0545  NA 144  K 4.3  CL 108  CO2 31  GLUCOSE 118*  BUN 36*  CREATININE 1.07*  CALCIUM 8.4*     Microbiology Results  Results for orders placed or performed during the hospital encounter of 09/15/15  Urine culture     Status: None   Collection Time: 09/15/15 10:37 AM  Result Value Ref Range Status   Specimen Description URINE, RANDOM  Final   Special Requests NONE  Final   Culture NO GROWTH 1 DAY  Final   Report Status 09/16/2015 FINAL  Final  Surgical pcr screen     Status: None   Collection Time: 09/15/15  3:23 PM  Result Value Ref Range Status   MRSA, PCR NEGATIVE NEGATIVE Final   Staphylococcus aureus NEGATIVE NEGATIVE Final    Comment:        The Xpert SA Assay (FDA approved for NASAL specimens in patients over 79 years of age), is one component of a comprehensive surveillance program.  Test performance  has been validated by [facility] for patients greater than or equal to 79 year old. It is not intended to diagnose infection nor to guide or monitor treatment.     Management plans discussed with the patient, family and they are in agreement.  CODE STATUS:     Code Status Orders        Start     Ordered   09/16/15 1608  Do not attempt resuscitation (DNR)   Continuous    Question Answer Comment  In the event of cardiac or respiratory ARREST Do not call a "code blue"   In the event of cardiac or respiratory ARREST Do not perform Intubation, CPR, defibrillation or ACLS   In the event of cardiac or respiratory ARREST Use medication by any route, position, wound care, and other measures to relive pain and suffering. May use oxygen, suction and manual treatment of airway obstruction as needed for comfort.      09/16/15 1607    Advance Directive Documentation        Most Recent Value   Type of Advance Directive  Out of facility DNR (pink MOST or yellow form)   Pre-existing out of facility DNR order (yellow form or pink MOST form)     "MOST" Form in Place?        TOTAL TIME TAKING CARE OF THIS PATIENT: 35 minutes.    Auburn BilberryPATEL, Aditri Louischarles M.D on 09/21/2015 at 9:11 AM  Between 7am to 6pm - Pager - [phone]  After 6pm go to [email or web address] - password [email or web address] [facility]  [place] [facility] [place] [facility]  Office  [phone]  CC: Primary care physician; Lydia BraunFITZGERALD, DAVID, MD

## 2015-09-21 NOTE — Progress Notes (Signed)
[facility] Cardiology [facility] Encounter Note  Patient: Lydia [name] / Admit Date: 09/15/2015 / Date of Encounter: 09/21/2015, 6:55 AM   Subjective: Patient's heart rate is much better controlled at this time  Review of Systems: Positive for: No apparent significant cardiac abnormalities overnight although patient cannot voice concerns  Objective: Telemetry: Atrial fibrillation with controlled ventricular rate Physical Exam: Blood pressure 117/57, pulse 71, temperature 97.5 F (36.4 C), temperature source Oral, resp. rate 18, height  (1.651 m), weight 148 lb 9.6 oz (67.405 kg), SpO2 97 %. Body mass index is 24.73 kg/(m^2). General: Well developed, well nourished, in no acute distress. Head: Normocephalic, atraumatic, sclera non-icteric, no xanthomas, nares are without discharge. Neck: No apparent masses Lungs: Normal respirations with no wheezes, no rhonchi, no rales , no crackles   Heart: Irregular rate and rhythm, normal S1 S2, 2+ mitral murmur, no rub, no gallop, PMI is normal size and placement, carotid upstroke normal without bruit, jugular venous pressure normal Abdomen: Soft, non-tender, non-distended with normoactive bowel sounds. No hepatosplenomegaly. Abdominal aorta is normal size without bruit Extremities: Trace edema, no clubbing, no cyanosis, no ulcers,  Peripheral: 2+ radial, 2+ femoral, 2+ dorsal pedal pulses   Intake/Output Summary (Last 24 hours) at 09/21/15 0655 Last data filed at 09/20/15 1800  Gross per 24 hour  Intake    360 ml  Output    225 ml  Net    135 ml    Inpatient Medications:  . acetaminophen  1,000 mg Oral Q6H  . apixaban  5 mg Oral BID  . calcitonin (salmon)  1 spray Alternating Nares Daily  . citalopram  10 mg Oral Daily  . diltiazem  120 mg Oral Daily  . docusate sodium  100 mg Oral BID  . feeding supplement (ENSURE ENLIVE)  237 mL Oral BID BM  . ferrous sulfate  325 mg Oral TID PC  . furosemide  40 mg Oral Daily  .  metoprolol tartrate  100 mg Oral BID  . senna  1 tablet Oral BID   Infusions:    Labs:  Recent Labs  09/19/15 0545  NA 144  K 4.3  CL 108  CO2 31  GLUCOSE 118*  BUN 36*  CREATININE 1.07*  CALCIUM 8.4*   No results for input(s): AST, ALT, ALKPHOS, BILITOT, PROT, ALBUMIN in the last 72 hours.  Recent Labs  09/19/15 0545  WBC 8.0  HGB 10.4*  HCT 32.1*  MCV 94.5  PLT 139*   No results for input(s): CKTOTAL, CKMB, TROPONINI in the last 72 hours. Invalid input(s): POCBNP No results for input(s): HGBA1C in the last 72 hours.   Weights: Filed Weights   09/15/15 0913 09/15/15 1200 09/16/15 0552  Weight: 146 lb 11.2 oz (66.543 kg) 147 lb 11.2 oz (66.996 kg) 148 lb 9.6 oz (67.405 kg)     Radiology/Studies:  Dg Chest 1 View  09/15/2015  CLINICAL DATA:  Right hip fracture.  Preop respiratory exam. EXAM: CHEST 1 VIEW COMPARISON:  02/19/2015 FINDINGS: Mild to moderate cardiomegaly stable. No evidence of pulmonary edema or other infiltrate. No evidence of pleural effusion. IMPRESSION: Cardiomegaly.  No active lung disease. Electronically Signed   By: Myles Rosenthal M.D.   On: 09/15/2015 10:11   Dg C-arm 61-120 Min  09/16/2015  CLINICAL DATA:  Internal fixation of RIGHT femur fracture EXAM: DG C-ARM 61-120 MIN; RIGHT FEMUR 2 VIEWS COMPARISON:  09/15/2015 FINDINGS: Intra medullary nail fixation of the intertrochanteric fracture on the RIGHT. Single distal  locking screw noted. Adjacent to the tip of the distal interlocking cortical screw, there is a short approximately 5 mm fragment of cortical elevation. IMPRESSION: Intra medullary nail fixation of intertrochanteric RIGHT femur fracture Electronically Signed   By: Genevive BiStewart  Edmunds M.D.   On: 09/16/2015 14:38   Dg Hip Unilat With Pelvis 2-3 Views Right  09/15/2015  CLINICAL DATA:  81 year old female with history of trauma from a fall while walking complaining of right-sided hip pain. EXAM: DG HIP (WITH OR WITHOUT PELVIS) 2-3V RIGHT  COMPARISON:  No priors. FINDINGS: No acute displaced fracture of the bony pelvic ring. There is a comminuted intertrochanteric fracture of the right femur with approximately 45 degrees of varus angulation, as well as some proximal migration of the fracture and lateral displacement. Right femoral head remains properly located. IMPRESSION: 1. Comminuted intertrochanteric fracture of the right femur with proximal migration and lateral displacement as well as approximately 45 degrees of varus angulation. Electronically Signed   By: Trudie Reedaniel  Entrikin M.D.   On: 09/15/2015 10:09   Dg Femur, Min 2 Views Right  09/16/2015  CLINICAL DATA:  Internal fixation of RIGHT femur fracture EXAM: DG C-ARM 61-120 MIN; RIGHT FEMUR 2 VIEWS COMPARISON:  09/15/2015 FINDINGS: Intra medullary nail fixation of the intertrochanteric fracture on the RIGHT. Single distal locking screw noted. Adjacent to the tip of the distal interlocking cortical screw, there is a short approximately 5 mm fragment of cortical elevation. IMPRESSION: Intra medullary nail fixation of intertrochanteric RIGHT femur fracture Electronically Signed   By: Genevive BiStewart  Edmunds M.D.   On: 09/16/2015 14:38   Dg Femur Port, Min 2 Views Right  09/16/2015  CLINICAL DATA:  Intertrochanteric fracture of the proximal right femur. EXAM: RIGHT FEMUR PORTABLE 1 VIEW COMPARISON:  Radiographs dated 09/15/2015 FINDINGS: The patient has undergone open reduction and internal fixation of the comminuted intertrochanteric fracture. There is an intramedullary nail and lag screw across the comminuted fracture of the proximal femur. Alignment and position of the fracture fragments is much improved. Lesser trochanter fragment remains displaced. IMPRESSION: Open reduction and internal fixation of comminuted proximal femur fracture. Electronically Signed   By: Francene BoyersJames  Maxwell M.D.   On: 09/16/2015 15:45     Assessment and Recommendation  81 y.o. female with chronic nonvalvular atrial  fibrillation with significant rapid ventricular rate after patient had an episode of fall and hip fracture status post surgery without complication 1. Continue diltiazem 120 mg each day for heart rate control 2. Decrease metoprolol to 100 mg twice per day for heart rate control due to slower heart rate with addition of diltiazem and digoxin 3. Abstain from digoxin orally and allow digoxin levels to drop 4. Continue anticoagulation for further risk reduction in stroke and pulmonary embolism 5. Okay for discharge to outside rehabilitation facility with follow-up as outpatient for adjustments of medications after above  Signed, Arnoldo HookerBruce Latham Kinzler M.D. FACC

## 2015-09-21 NOTE — Progress Notes (Signed)
Pts BP 98/45, HR 85. Per MD Allena KatzPatel hold Cardizem, and give Metoprolol. Will continue to monitor.

## 2015-09-21 NOTE — Progress Notes (Signed)
Cardiologist in to see pt this AM and adjusted metoprolol. Had a pause last night at 2303. HR has been stable but irregular in the 50s-70s. Pt has been asymptomatic with afib. Had a large BM that was dark in color, and incontinence episode that saturated the entire incontinence pad, but still had a bladder scan post void that was greater than 500 ml. Passed on to day shift RN about in and out cath this AM since pt may be still retaining urine.

## 2015-09-21 NOTE — Progress Notes (Signed)
Clinical Social Worker informed by Nell RangeShreyand Patel, MD that patient is medically ready to discharge, Patient and daughter are in a agreement with plan. Daughter Gavin PoundDeborah at bedside.   Call to [facility] to confirm that patient's bed is ready. Provided patient's room number 217 and number to call for report [phone] .  CSW updated FL2 and completed discharge packet.   RN will call report and patient will [facility] via EMS.  Sammuel Hineseborah Geneva Barrero. Theresia MajorsLCSWA, MSW Clinical Social Work Department [phone] 10:32 AM

## 2015-09-21 NOTE — Progress Notes (Signed)
  Subjective:  Patient reports pain as mild.  Patient doing well. Patient is examined with her daughter. Patient has no complaints.  Objective:   VITALS:   Filed Vitals:   09/21/15 0453 09/21/15 0730 09/21/15 0911 09/21/15 1216  BP: 117/57 116/68 98/45 94/54   Pulse: 71 80 85 73  Temp: 97.5 F (36.4 C)   98 F (36.7 C)  TempSrc: Oral   Oral  Resp: 18 18  18   Height:      Weight:      SpO2: 97% 95%  97%    PHYSICAL EXAM:  Examination of right lower extremity demonstrates mild serous drainage from the proximal incisions. I personally changed the dressing today. Her thigh compartments are soft and compressible. There is no erythema or ecchymosis or swelling seen. Distally she is neurovascular intact throughout the right upper extremity.   LABS  No results found for this or any previous visit (from the past 24 hour(s)).  No results found.  Assessment/Plan: 5 Days Post-Op   Active Problems:   Hip fracture, right (HCC)   Atrial fibrillation (HCC)   Hip fracture (HCC)   Pressure ulcer  Patient well postop. She is ready for discharge from an orthopedic standpoint. She'll follow-up with me in the office in 10-14 days.    Juanell FairlyKRASINSKI, Mea Ozga , MD 09/21/2015, 12:28 PM

## 2015-09-21 NOTE — Progress Notes (Signed)
PT Cancellation Note  Patient Details Name: Lydia [name] MRN: [id number] DOB: 07/08/1928   Cancelled Treatment:    Reason Eval/Treat Not Completed: Patient declined, no reason specified (imminent departure, pt confused).  Planning to go to SNF today.   Ivar DrapeStout, Kameka Whan E 09/21/2015, 10:46 AM   Samul Dadauth Tanganika Barradas, PT MS Acute Rehab Dept. Number: [facility] [id number] [phone] and [facility] [phone]

## 2015-09-21 NOTE — Clinical Social Work Placement (Signed)
   CLINICAL SOCIAL WORK PLACEMENT  NOTE  Date:  09/21/2015  Patient Details  Name: Lydia [name] MRN: [id number] Date of Birth: 06/05/1928  Clinical Social Work is seeking post-discharge placement for this patient at the Skilled  Nursing Facility level of care (*CSW will initial, date and re-position this form in  chart as items are completed):  Yes   Patient/family provided with [facility] Clinical Social Work Department's list of facilities offering this level of care within the geographic area requested by the patient (or if unable, by the patient's family).  Yes   Patient/family informed of their freedom to choose among providers that offer the needed level of care, that participate in Medicare, Medicaid or managed care program needed by the patient, have an available bed and are willing to accept the patient.  Yes   Patient/family informed of 's ownership interest in [facility] and [facility], as well as of the fact that they are under no obligation to receive care at these facilities.  PASRR submitted to EDS on       PASRR number received on       Existing PASRR number confirmed on 09/16/15     FL2 transmitted to all facilities in geographic area requested by pt/family on 09/16/15     FL2 transmitted to all facilities within larger geographic area on       Patient informed that his/her managed care company has contracts with or will negotiate with certain facilities, including the following:        Yes   Patient/family informed of bed offers received.  Patient chooses bed at  [facility] )     Physician recommends and patient chooses bed at      Patient to be transferred to  [facility] ) on 09/21/15.  Patient to be transferred to facility by  [facility] )     Patient family notified on 09/21/15 of transfer.  Name of family member notified:   (Daughter Gavin PoundDeborah at bedside. )     PHYSICIAN       Additional Comment:     _______________________________________________ Soundra PilonMoore, Berthel Bagnall H, LCSW 09/21/2015, 10:27 AM

## 2015-09-21 NOTE — Progress Notes (Signed)
Report called to Anastasia PallKim Mayfield, RN at [facility]. Pts VSS. Pt waiting on EMS transportation.

## 2015-09-22 LAB — URINE CULTURE
Culture: NO GROWTH
SPECIAL REQUESTS: NORMAL

## 2015-09-24 DIAGNOSIS — R350 Frequency of micturition: Secondary | ICD-10-CM | POA: Diagnosis not present

## 2015-09-24 LAB — URINALYSIS COMPLETE WITH MICROSCOPIC (ARMC ONLY)
Bilirubin Urine: NEGATIVE
Glucose, UA: NEGATIVE mg/dL
Ketones, ur: NEGATIVE mg/dL
Nitrite: NEGATIVE
PH: 6 (ref 5.0–8.0)
PROTEIN: 30 mg/dL — AB
Specific Gravity, Urine: 1.02 (ref 1.005–1.030)

## 2015-09-26 LAB — URINE CULTURE

## 2015-10-01 ENCOUNTER — Encounter
Admission: RE | Admit: 2015-10-01 | Discharge: 2015-10-01 | Disposition: A | Discharge status: Home / Self Care | Payer: Medicare Other | Source: Ambulatory Visit | Attending: Internal Medicine | Admitting: Internal Medicine

## 2015-10-01 DIAGNOSIS — D649 Anemia, unspecified: Secondary | ICD-10-CM | POA: Insufficient documentation

## 2015-10-01 DIAGNOSIS — R000 Tachycardia, unspecified: Secondary | ICD-10-CM | POA: Insufficient documentation

## 2015-10-01 LAB — CBC WITH DIFFERENTIAL/PLATELET
BASOS PCT: 1 %
Basophils Absolute: 0 10*3/uL (ref 0–0.1)
EOS ABS: 0.1 10*3/uL (ref 0–0.7)
EOS PCT: 1 %
HCT: 34.9 % — ABNORMAL LOW (ref 35.0–47.0)
HEMOGLOBIN: 11 g/dL — AB (ref 12.0–16.0)
LYMPHS ABS: 0.9 10*3/uL — AB (ref 1.0–3.6)
Lymphocytes Relative: 12 %
MCH: 30.8 pg (ref 26.0–34.0)
MCHC: 31.6 g/dL — AB (ref 32.0–36.0)
MCV: 97.4 fL (ref 80.0–100.0)
MONOS PCT: 10 %
Monocytes Absolute: 0.7 10*3/uL (ref 0.2–0.9)
NEUTROS PCT: 76 %
Neutro Abs: 5.4 10*3/uL (ref 1.4–6.5)
PLATELETS: 273 10*3/uL (ref 150–440)
RBC: 3.58 MIL/uL — ABNORMAL LOW (ref 3.80–5.20)
RDW: 17 % — ABNORMAL HIGH (ref 11.5–14.5)
WBC: 7.1 10*3/uL (ref 3.6–11.0)

## 2015-10-01 LAB — COMPREHENSIVE METABOLIC PANEL
ALBUMIN: 3.1 g/dL — AB (ref 3.5–5.0)
ALK PHOS: 219 U/L — AB (ref 38–126)
ALT: 15 U/L (ref 14–54)
ANION GAP: 8 (ref 5–15)
AST: 20 U/L (ref 15–41)
BUN: 28 mg/dL — ABNORMAL HIGH (ref 6–20)
CALCIUM: 9 mg/dL (ref 8.9–10.3)
CO2: 27 mmol/L (ref 22–32)
CREATININE: 0.91 mg/dL (ref 0.44–1.00)
Chloride: 106 mmol/L (ref 101–111)
GFR, EST NON AFRICAN AMERICAN: 56 mL/min — AB (ref >60)
Glucose, Bld: 126 mg/dL — ABNORMAL HIGH (ref 65–99)
Potassium: 4.4 mmol/L (ref 3.5–5.1)
SODIUM: 141 mmol/L (ref 135–145)
TOTAL PROTEIN: 6.1 g/dL — AB (ref 6.5–8.1)
Total Bilirubin: 0.7 mg/dL (ref 0.3–1.2)

## 2015-10-09 ENCOUNTER — Other Ambulatory Visit
Admission: RE | Admit: 2015-10-09 | Discharge: 2015-10-09 | Disposition: A | Discharge status: Home / Self Care | Payer: Medicare Other | Source: Skilled Nursing Facility | Attending: Internal Medicine | Admitting: Internal Medicine

## 2015-10-09 DIAGNOSIS — R350 Frequency of micturition: Secondary | ICD-10-CM | POA: Insufficient documentation

## 2015-10-09 DIAGNOSIS — R8299 Other abnormal findings in urine: Secondary | ICD-10-CM | POA: Diagnosis not present

## 2015-10-11 LAB — URINE CULTURE: Culture: 80000

## 2015-10-13 DIAGNOSIS — R000 Tachycardia, unspecified: Secondary | ICD-10-CM | POA: Diagnosis not present

## 2015-10-13 LAB — URINALYSIS COMPLETE WITH MICROSCOPIC (ARMC ONLY)
Bilirubin Urine: NEGATIVE
Glucose, UA: NEGATIVE mg/dL
HGB URINE DIPSTICK: NEGATIVE
Ketones, ur: NEGATIVE mg/dL
LEUKOCYTES UA: NEGATIVE
Nitrite: POSITIVE — AB
PH: 5 (ref 5.0–8.0)
Protein, ur: NEGATIVE mg/dL
Specific Gravity, Urine: 1.023 (ref 1.005–1.030)

## 2015-10-15 LAB — URINE CULTURE: Culture: >=100000

## 2015-10-16 ENCOUNTER — Ambulatory Visit
Admission: RE | Admit: 2015-10-16 | Discharge: 2015-10-16 | Disposition: A | Discharge status: Home / Self Care | Payer: Medicare Other | Source: Ambulatory Visit | Attending: Gerontology | Admitting: Gerontology

## 2015-10-16 ENCOUNTER — Other Ambulatory Visit: Payer: Self-pay | Admitting: Gerontology

## 2015-10-16 DIAGNOSIS — R000 Tachycardia, unspecified: Secondary | ICD-10-CM | POA: Diagnosis not present

## 2015-10-16 DIAGNOSIS — I2699 Other pulmonary embolism without acute cor pulmonale: Secondary | ICD-10-CM

## 2015-10-16 DIAGNOSIS — I700 Atherosclerosis of aorta: Secondary | ICD-10-CM | POA: Diagnosis not present

## 2015-10-16 DIAGNOSIS — J9811 Atelectasis: Secondary | ICD-10-CM | POA: Diagnosis not present

## 2015-10-16 DIAGNOSIS — I959 Hypotension, unspecified: Secondary | ICD-10-CM | POA: Insufficient documentation

## 2015-10-16 DIAGNOSIS — I82409 Acute embolism and thrombosis of unspecified deep veins of unspecified lower extremity: Secondary | ICD-10-CM | POA: Diagnosis present

## 2015-10-16 DIAGNOSIS — J90 Pleural effusion, not elsewhere classified: Secondary | ICD-10-CM | POA: Insufficient documentation

## 2015-10-16 MED ORDER — IOHEXOL 350 MG/ML SOLN
100.0000 mL | Freq: Once | INTRAVENOUS | Status: AC | PRN
  Administered 2015-10-16: 100 mL via INTRAVENOUS

## 2015-10-21 ENCOUNTER — Other Ambulatory Visit
Admission: RE | Admit: 2015-10-21 | Discharge: 2015-10-21 | Disposition: A | Discharge status: Home / Self Care | Payer: Medicare Other | Source: Skilled Nursing Facility | Attending: Gerontology | Admitting: Gerontology

## 2015-10-21 DIAGNOSIS — R000 Tachycardia, unspecified: Secondary | ICD-10-CM | POA: Diagnosis present

## 2015-10-21 LAB — CBC WITH DIFFERENTIAL/PLATELET
Basophils Absolute: 0 10*3/uL (ref 0–0.1)
Basophils Relative: 1 %
EOS PCT: 2 %
Eosinophils Absolute: 0.1 10*3/uL (ref 0–0.7)
HCT: 34.1 % — ABNORMAL LOW (ref 35.0–47.0)
Hemoglobin: 11 g/dL — ABNORMAL LOW (ref 12.0–16.0)
LYMPHS ABS: 0.7 10*3/uL — AB (ref 1.0–3.6)
LYMPHS PCT: 15 %
MCH: 31.2 pg (ref 26.0–34.0)
MCHC: 32.4 g/dL (ref 32.0–36.0)
MCV: 96.2 fL (ref 80.0–100.0)
MONOS PCT: 13 %
Monocytes Absolute: 0.6 10*3/uL (ref 0.2–0.9)
Neutro Abs: 3.4 10*3/uL (ref 1.4–6.5)
Neutrophils Relative %: 69 %
PLATELETS: 166 10*3/uL (ref 150–440)
RBC: 3.54 MIL/uL — AB (ref 3.80–5.20)
RDW: 15.5 % — ABNORMAL HIGH (ref 11.5–14.5)
WBC: 4.9 10*3/uL (ref 3.6–11.0)

## 2015-10-21 LAB — COMPREHENSIVE METABOLIC PANEL
ALK PHOS: 180 U/L — AB (ref 38–126)
ALT: 18 U/L (ref 14–54)
AST: 21 U/L (ref 15–41)
Albumin: 3.2 g/dL — ABNORMAL LOW (ref 3.5–5.0)
Anion gap: 7 (ref 5–15)
BUN: 22 mg/dL — ABNORMAL HIGH (ref 6–20)
CALCIUM: 8.4 mg/dL — AB (ref 8.9–10.3)
CHLORIDE: 97 mmol/L — AB (ref 101–111)
CO2: 28 mmol/L (ref 22–32)
CREATININE: 0.84 mg/dL (ref 0.44–1.00)
Glucose, Bld: 100 mg/dL — ABNORMAL HIGH (ref 65–99)
Potassium: 3.5 mmol/L (ref 3.5–5.1)
Sodium: 132 mmol/L — ABNORMAL LOW (ref 135–145)
Total Bilirubin: 0.6 mg/dL (ref 0.3–1.2)
Total Protein: 6.2 g/dL — ABNORMAL LOW (ref 6.5–8.1)

## 2015-10-21 LAB — TSH: TSH: 4.389 u[IU]/mL (ref 0.350–4.500)

## 2015-10-22 LAB — VITAMIN B12: Vitamin B-12: 259 pg/mL (ref 180–914)

## 2015-10-23 LAB — CALCITRIOL (1,25 DI-OH VIT D): Vit D, 1,25-Dihydroxy: 30.4 pg/mL (ref 19.9–79.3)

## 2015-10-29 DIAGNOSIS — R000 Tachycardia, unspecified: Secondary | ICD-10-CM | POA: Diagnosis not present

## 2015-10-29 LAB — COMPREHENSIVE METABOLIC PANEL
ALBUMIN: 3.5 g/dL (ref 3.5–5.0)
ALK PHOS: 150 U/L — AB (ref 38–126)
ALT: 20 U/L (ref 14–54)
ANION GAP: 6 (ref 5–15)
AST: 25 U/L (ref 15–41)
BUN: 31 mg/dL — AB (ref 6–20)
CALCIUM: 9.3 mg/dL (ref 8.9–10.3)
CO2: 31 mmol/L (ref 22–32)
Chloride: 105 mmol/L (ref 101–111)
Creatinine, Ser: 0.91 mg/dL (ref 0.44–1.00)
GFR calc Af Amer: >60 mL/min (ref >60)
GFR calc non Af Amer: 56 mL/min — ABNORMAL LOW (ref >60)
GLUCOSE: 92 mg/dL (ref 65–99)
Potassium: 3.8 mmol/L (ref 3.5–5.1)
SODIUM: 142 mmol/L (ref 135–145)
Total Bilirubin: 0.9 mg/dL (ref 0.3–1.2)
Total Protein: 6.3 g/dL — ABNORMAL LOW (ref 6.5–8.1)

## 2015-10-29 LAB — CBC WITH DIFFERENTIAL/PLATELET
BASOS ABS: 0 10*3/uL (ref 0–0.1)
Basophils Relative: 1 %
EOS ABS: 0.1 10*3/uL (ref 0–0.7)
Eosinophils Relative: 1 %
HCT: 33.7 % — ABNORMAL LOW (ref 35.0–47.0)
HEMOGLOBIN: 10.8 g/dL — AB (ref 12.0–16.0)
Lymphocytes Relative: 13 %
Lymphs Abs: 0.9 10*3/uL — ABNORMAL LOW (ref 1.0–3.6)
MCH: 30.4 pg (ref 26.0–34.0)
MCHC: 31.9 g/dL — AB (ref 32.0–36.0)
MCV: 95.1 fL (ref 80.0–100.0)
MONOS PCT: 11 %
Monocytes Absolute: 0.7 10*3/uL (ref 0.2–0.9)
NEUTROS ABS: 4.8 10*3/uL (ref 1.4–6.5)
NEUTROS PCT: 74 %
Platelets: 160 10*3/uL (ref 150–440)
RBC: 3.54 MIL/uL — AB (ref 3.80–5.20)
RDW: 15.1 % — ABNORMAL HIGH (ref 11.5–14.5)
WBC: 6.4 10*3/uL (ref 3.6–11.0)

## 2015-11-25 ENCOUNTER — Emergency Department

## 2015-11-25 ENCOUNTER — Inpatient Hospital Stay
Admission: EM | Admit: 2015-11-25 | Discharge: 2015-12-01 | DRG: 291 | Disposition: E | Attending: Internal Medicine | Admitting: Internal Medicine

## 2015-11-25 ENCOUNTER — Encounter: Payer: Self-pay | Admitting: Emergency Medicine

## 2015-11-25 DIAGNOSIS — F0390 Unspecified dementia without behavioral disturbance: Secondary | ICD-10-CM | POA: Diagnosis present

## 2015-11-25 DIAGNOSIS — K5900 Constipation, unspecified: Secondary | ICD-10-CM | POA: Diagnosis present

## 2015-11-25 DIAGNOSIS — I110 Hypertensive heart disease with heart failure: Secondary | ICD-10-CM | POA: Diagnosis not present

## 2015-11-25 DIAGNOSIS — J9621 Acute and chronic respiratory failure with hypoxia: Secondary | ICD-10-CM | POA: Diagnosis present

## 2015-11-25 DIAGNOSIS — Z8049 Family history of malignant neoplasm of other genital organs: Secondary | ICD-10-CM | POA: Diagnosis not present

## 2015-11-25 DIAGNOSIS — R0902 Hypoxemia: Secondary | ICD-10-CM | POA: Diagnosis present

## 2015-11-25 DIAGNOSIS — Z515 Encounter for palliative care: Secondary | ICD-10-CM | POA: Diagnosis present

## 2015-11-25 DIAGNOSIS — J810 Acute pulmonary edema: Secondary | ICD-10-CM

## 2015-11-25 DIAGNOSIS — Z801 Family history of malignant neoplasm of trachea, bronchus and lung: Secondary | ICD-10-CM

## 2015-11-25 DIAGNOSIS — J449 Chronic obstructive pulmonary disease, unspecified: Secondary | ICD-10-CM | POA: Diagnosis present

## 2015-11-25 DIAGNOSIS — I119 Hypertensive heart disease without heart failure: Secondary | ICD-10-CM | POA: Diagnosis not present

## 2015-11-25 DIAGNOSIS — Z90710 Acquired absence of both cervix and uterus: Secondary | ICD-10-CM

## 2015-11-25 DIAGNOSIS — I4891 Unspecified atrial fibrillation: Secondary | ICD-10-CM | POA: Insufficient documentation

## 2015-11-25 DIAGNOSIS — I5033 Acute on chronic diastolic (congestive) heart failure: Secondary | ICD-10-CM | POA: Diagnosis present

## 2015-11-25 DIAGNOSIS — I482 Chronic atrial fibrillation: Secondary | ICD-10-CM | POA: Diagnosis present

## 2015-11-25 DIAGNOSIS — Z66 Do not resuscitate: Secondary | ICD-10-CM | POA: Diagnosis present

## 2015-11-25 DIAGNOSIS — J40 Bronchitis, not specified as acute or chronic: Secondary | ICD-10-CM | POA: Diagnosis present

## 2015-11-25 DIAGNOSIS — R0602 Shortness of breath: Secondary | ICD-10-CM | POA: Diagnosis not present

## 2015-11-25 DIAGNOSIS — J9601 Acute respiratory failure with hypoxia: Secondary | ICD-10-CM | POA: Diagnosis not present

## 2015-11-25 DIAGNOSIS — Z7901 Long term (current) use of anticoagulants: Secondary | ICD-10-CM

## 2015-11-25 DIAGNOSIS — R042 Hemoptysis: Secondary | ICD-10-CM | POA: Diagnosis present

## 2015-11-25 DIAGNOSIS — I5031 Acute diastolic (congestive) heart failure: Secondary | ICD-10-CM | POA: Diagnosis not present

## 2015-11-25 LAB — COMPREHENSIVE METABOLIC PANEL
ALT: 15 U/L (ref 14–54)
ANION GAP: 8 (ref 5–15)
AST: 26 U/L (ref 15–41)
Albumin: 4.1 g/dL (ref 3.5–5.0)
Alkaline Phosphatase: 120 U/L (ref 38–126)
BUN: 23 mg/dL — AB (ref 6–20)
CHLORIDE: 104 mmol/L (ref 101–111)
CO2: 29 mmol/L (ref 22–32)
CREATININE: 0.82 mg/dL (ref 0.44–1.00)
Calcium: 9.4 mg/dL (ref 8.9–10.3)
GFR calc non Af Amer: >60 mL/min (ref >60)
GLUCOSE: 135 mg/dL — AB (ref 65–99)
POTASSIUM: 3.9 mmol/L (ref 3.5–5.1)
SODIUM: 141 mmol/L (ref 135–145)
Total Bilirubin: 1.5 mg/dL — ABNORMAL HIGH (ref 0.3–1.2)
Total Protein: 6.9 g/dL (ref 6.5–8.1)

## 2015-11-25 LAB — CBC
HCT: 39.3 % (ref 35.0–47.0)
Hemoglobin: 12.5 g/dL (ref 12.0–16.0)
MCH: 28.8 pg (ref 26.0–34.0)
MCHC: 31.8 g/dL — AB (ref 32.0–36.0)
MCV: 90.5 fL (ref 80.0–100.0)
PLATELETS: 189 10*3/uL (ref 150–440)
RBC: 4.34 MIL/uL (ref 3.80–5.20)
RDW: 14.2 % (ref 11.5–14.5)
WBC: 12.1 10*3/uL — ABNORMAL HIGH (ref 3.6–11.0)

## 2015-11-25 LAB — TROPONIN I: Troponin I: <0.03 ng/mL (ref <0.031)

## 2015-11-25 MED ORDER — ALBUTEROL SULFATE (2.5 MG/3ML) 0.083% IN NEBU: 5.0000 mg | INHALATION_SOLUTION | Freq: Once | RESPIRATORY_TRACT | Status: DC

## 2015-11-25 MED ORDER — MOMETASONE FURO-FORMOTEROL FUM 100-5 MCG/ACT IN AERO
2.0000 | INHALATION_SPRAY | Freq: Two times a day (BID) | RESPIRATORY_TRACT | Status: DC
  Administered 2015-11-25 – 2015-11-26 (×2): 2 via RESPIRATORY_TRACT
  Filled 2015-11-25: qty 8.8

## 2015-11-25 MED ORDER — HALOPERIDOL LACTATE 5 MG/ML IJ SOLN
0.5000 mg | INTRAMUSCULAR | Status: DC | PRN
Start: 2015-11-25 — End: 2015-11-29

## 2015-11-25 MED ORDER — ENSURE ENLIVE PO LIQD
237.0000 mL | Freq: Two times a day (BID) | ORAL | Status: DC
  Administered 2015-11-26: 10:00:00 237 mL via ORAL

## 2015-11-25 MED ORDER — POLYETHYLENE GLYCOL 3350 17 GM/SCOOP PO POWD
17.0000 g | Freq: Every day | ORAL | Status: DC | PRN
  Filled 2015-11-25: qty 255

## 2015-11-25 MED ORDER — BARRIER CREAM NON-SPECIFIED: 1.0000 "application " | TOPICAL_CREAM | Freq: Two times a day (BID) | TOPICAL | Status: DC

## 2015-11-25 MED ORDER — ACETAMINOPHEN 650 MG RE SUPP
650.0000 mg | Freq: Four times a day (QID) | RECTAL | Status: DC | PRN
  Administered 2015-11-28 – 2015-11-29 (×2): 650 mg via RECTAL
  Filled 2015-11-25 (×2): qty 1

## 2015-11-25 MED ORDER — HALOPERIDOL 1 MG PO TABS: 0.5000 mg | ORAL_TABLET | ORAL | Status: DC | PRN

## 2015-11-25 MED ORDER — CITALOPRAM HYDROBROMIDE 20 MG PO TABS
10.0000 mg | ORAL_TABLET | Freq: Every day | ORAL | Status: DC
  Administered 2015-11-26: 10:00:00 10 mg via ORAL
  Filled 2015-11-25: qty 1

## 2015-11-25 MED ORDER — HALOPERIDOL LACTATE 2 MG/ML PO CONC
0.5000 mg | ORAL | Status: DC | PRN
  Administered 2015-11-26: 0.5 mg via SUBLINGUAL
  Filled 2015-11-25 (×2): qty 0.3

## 2015-11-25 MED ORDER — ACETAMINOPHEN 325 MG PO TABS
650.0000 mg | ORAL_TABLET | Freq: Four times a day (QID) | ORAL | Status: DC | PRN
  Administered 2015-11-25: 19:00:00 650 mg via ORAL
  Filled 2015-11-25: qty 2

## 2015-11-25 MED ORDER — FUROSEMIDE 20 MG PO TABS
20.0000 mg | ORAL_TABLET | Freq: Every day | ORAL | Status: DC
  Administered 2015-11-26: 20 mg via ORAL
  Filled 2015-11-25: qty 1

## 2015-11-25 MED ORDER — MORPHINE SULFATE (PF) 2 MG/ML IV SOLN
2.0000 mg | INTRAVENOUS | Status: DC | PRN
  Administered 2015-11-25: 2 mg via INTRAVENOUS
  Filled 2015-11-25: qty 1

## 2015-11-25 MED ORDER — ONDANSETRON HCL 4 MG/2ML IJ SOLN
4.0000 mg | Freq: Four times a day (QID) | INTRAMUSCULAR | Status: DC | PRN
  Filled 2015-11-25: qty 2

## 2015-11-25 MED ORDER — ONDANSETRON 4 MG PO TBDP
4.0000 mg | ORAL_TABLET | Freq: Four times a day (QID) | ORAL | Status: DC | PRN
  Filled 2015-11-25: qty 1

## 2015-11-25 MED ORDER — ZINC OXIDE 40 % EX OINT
TOPICAL_OINTMENT | Freq: Two times a day (BID) | CUTANEOUS | Status: DC
  Administered 2015-11-26: 10:00:00 via TOPICAL
  Filled 2015-11-25: qty 114

## 2015-11-25 MED ORDER — FUROSEMIDE 10 MG/ML IJ SOLN: 40.0000 mg | Freq: Once | INTRAMUSCULAR | Status: DC

## 2015-11-25 MED ORDER — MORPHINE SULFATE (PF) 2 MG/ML IV SOLN
1.0000 mg | INTRAVENOUS | Status: DC | PRN
  Administered 2015-11-25 – 2015-11-26 (×3): 1 mg via INTRAVENOUS
  Filled 2015-11-25 (×3): qty 1

## 2015-11-25 NOTE — ED Provider Notes (Signed)
[facility] Emergency Department Provider Note  ____________________________________________  Time seen: On arrival  I have reviewed the triage vital signs and the nursing notes.   HISTORY  Chief Complaint Shortness of Breath  Limited by severe dementia  HPI Lydia [name] is a 81 y.o. female who is in the [facility] hospice program and he presents today for evaluation secondary to cough with possible blood-tinged sputum. Also reported hypoxia at [facility]. No fevers noted by staff. Patient with significant dementia limits history. Daughters report that they do not want any invasive treatment for the patient and would prefer to not admit her instead they would like to go to hospice home if possible.       Past Medical History  Diagnosis Date  . Hypertension   . A-fib [facility])     Patient Active Problem List   Diagnosis Date Noted  . Pressure ulcer 09/16/2015  . Hip fracture, right (HCC) 09/15/2015  . Atrial fibrillation (HCC) 09/15/2015  . Hip fracture (HCC) 09/15/2015  . Hernia, inguinal, right 10/10/2013    Past Surgical History  Procedure Laterality Date  . Appendectomy    . Abdominal hysterectomy  1964  . Hernia repair Right 10/19/13    inguinal  . Femur im nail Right 09/16/2015    Procedure: INTRAMEDULLARY (IM) NAIL FEMORAL;  Surgeon: Juanell Fairly, MD;  Location: [facility] ORS;  Service: Orthopedics;  Laterality: Right;    Current Outpatient Rx  Name  Route  Sig  Dispense  Refill  . acetaminophen (TYLENOL) 325 MG tablet   Oral   Take 650 mg by mouth every 4 (four) hours as needed for mild pain or moderate pain.         . citalopram (CELEXA) 10 MG tablet   Oral   Take 10 mg by mouth daily.         Marland Kitchen diltiazem (DILACOR XR) 180 MG 24 hr capsule   Oral   Take 180 mg by mouth daily.         . Fluticasone-Salmeterol (ADVAIR DISKUS) 100-50 MCG/DOSE AEPB   Inhalation   Inhale 1 puff into the lungs 2 (two) times daily.          . furosemide (LASIX) 40 MG tablet   Oral   Take 0.5 tablets (20 mg total) by mouth daily.   30 tablet      . metoprolol (LOPRESSOR) 100 MG tablet   Oral   Take 1 tablet (100 mg total) by mouth 2 (two) times daily.   30 tablet   0   . polyethylene glycol powder (GLYCOLAX/MIRALAX) powder   Oral   Take 17 g by mouth daily as needed. For constipation. Mis with 4-8oz of fluid.         . potassium chloride SA (K-DUR,KLOR-CON) 20 MEQ tablet   Oral   Take 1 tablet (20 mEq total) by mouth daily.         . rivaroxaban (XARELTO) 20 MG TABS tablet   Oral   Take 20 mg by mouth daily.         . traMADol (ULTRAM) 50 MG tablet   Oral   Take 0.5 tablets (25 mg total) by mouth every 8 (eight) hours as needed. Take at 8am, 2pm, and 8pm.   30 tablet   0     Allergies Review of patient's allergies indicates no known allergies.  Family History  Problem Relation Age of Onset  . Cervical cancer Mother   . Lung  cancer Brother     Social History Social History  Substance Use Topics  . Smoking status: Never Smoker   . Smokeless tobacco: Never Used  . Alcohol Use: No    L5 caveat: Review of Systems Limited by severe dementia  Positive cough Fevers Positive shortness of breath  ____________________________________________   PHYSICAL EXAM:  VITAL SIGNS: ED Triage Vitals  Enc Vitals Group     BP 11/19/2015 0923 153/93 mmHg     Pulse Rate 11/28/2015 0923 133     Resp 11/15/2015 0923 14     Temp 11/17/2015 0921 98.8 F (37.1 C)     Temp Source 11/28/2015 0921 Oral     SpO2 11/13/2015 0921 94 %     Weight 11/04/2015 0923 155 lb (70.308 kg)     Height 11/11/2015 0923 5\' 2"  (1.575 m)     Head Cir --      Peak Flow --      Pain Score --      Pain Loc --      Pain Edu? --      Excl. in GC? --      Constitutional: Well appearing and in no distress. Eyes: Conjunctivae are normal.  ENT   Head: Normocephalic and atraumatic.   Mouth/Throat: Mucous membranes are  moist. Cardiovascular: Tachycardia, irregularly irregular rhythm. Normal and symmetric distal pulses are present in all extremities. No murmurs, rubs, or gallops. Respiratory: Positive tachypnea. Scattered Rales bilaterally Gastrointestinal: Soft and non-tender in all quadrants. No distention. There is no CVA tenderness. Genitourinary: deferred Musculoskeletal: Nontender with normal range of motion in all extremities. No lower extremity tenderness nor edema. Neurologic:  Normal speech and language. No gross focal neurologic deficits are appreciated. Skin:  Skin is warm, dry and intact. No rash noted. Psychiatric: Mood and affect are normal. Patient exhibits appropriate insight and judgment.  ____________________________________________    LABS (pertinent positives/negatives)  Labs Reviewed  CBC - Abnormal; Notable for the following:    WBC 12.1 (*)    MCHC 31.8 (*)    All other components within normal limits  COMPREHENSIVE METABOLIC PANEL - Abnormal; Notable for the following:    Glucose, Bld 135 (*)    BUN 23 (*)    Total Bilirubin 1.5 (*)    All other components within normal limits  TROPONIN I    ____________________________________________   EKG ED ECG REPORT I, Jene EveryKINNER, Raye Slyter, the attending physician, personally viewed and interpreted this ECG.   Date: 11/22/2015  EKG Time: 9:29 AM  Rate: 133  Rhythm: atrial fibrillation, rate 133  Axis: Normal  Intervals:none  ST&T Change: Nonspecific changes   ____________________________________________    RADIOLOGY I have personally reviewed any xrays that were ordered on this patient: Chest x-ray shows bilateral pulmonary edema  ____________________________________________   PROCEDURES  Procedure(s) performed: none  Critical Care performed: none  ____________________________________________   INITIAL IMPRESSION / ASSESSMENT AND PLAN / ED COURSE  Pertinent labs & imaging results that were available during my  care of the patient were reviewed by me and considered in my medical decision making (see chart for details).  Patient is comfortable while on oxygen. We are determining whether [facility] as patient is followed by hospice there. There are no hospice beds available at this time. If unable to provide oxygen [facility] we will admit for comfort care  [facility] to [facility]  ____________________________________________   FINAL  CLINICAL IMPRESSION(S) / ED DIAGNOSES  Final diagnoses:  Acute pulmonary edema (HCC)  Atrial fibrillation with rapid ventricular response (HCC)     Jene Every, MD 11/26/15 1520

## 2015-11-25 NOTE — ED Notes (Signed)
Pt with 1 assist to commode in room, pt urinated and had normal sized bowel movement.

## 2015-11-25 NOTE — Care Management Note (Signed)
Case Management Note  Patient Details  Name: Jeraldine LootsBetty G Robinson MRN: [id number] Date of Birth: 01/15/1928  Subjective/Objective:       Otilio Saberontacted Avigdor Dollar RN with Hospice Home. [facility] informed vanessa, RN for the pt. That they cannot provide 02 for the patient which is required at this time. Patient will be admitted to comfort care until Hospice Home bed is available. [facility] to let on call person know patient is being kept .             Action/Plan:   Expected Discharge Date:                  Expected Discharge Plan:     In-House Referral:     Discharge planning Services     Post Acute Care Choice:    Choice offered to:     DME Arranged:    DME Agency:     HH Arranged:    HH Agency:     Status of Service:     Medicare Important Message Given:    Date Medicare IM Given:    Medicare IM give by:    Date Additional Medicare IM Given:    Additional Medicare Important Message give by:     If discussed at Long Length of Stay Meetings, dates discussed:    Additional Comments:  Berna BueCheryl Laquentin Loudermilk, RN 11/28/2015, 12:29 PM

## 2015-11-25 NOTE — ED Notes (Signed)
[facility] [place], [facility] in regards to available resources needed for pt. [facility] reports not able to provide needed comfort care.

## 2015-11-25 NOTE — ED Notes (Signed)
[facility] with EMS reports of evaluation of pneumonia and blood tinged sputum. O2 reported by facility was 73%. EMS reported O2 after 4L 94%. Hx of blood clot in right leg, skin tear on left lower leg, COPD, CHF, dementia, A-Fib. Pt has DNR, reported by family. Pts POA at bedside.

## 2015-11-25 NOTE — H&P (Signed)
Lydia [name] is an 81 y.o. female.   Chief Complaint: Shortness of breath HPI: The patient presents to the emergency department from the memory care unit at her nursing home. She is now requiring up to 5 L of oxygen via nasal cannula to maintain oxygen saturations greater than 92%. The facility from which she came cannot supply oxygen at this rate which prompted her family to consult palliative care. They have agreed on hospice treatment but the patient hospice facility does not have available beds at this time. Thus the emergency department staff requested admission for palliative care.   Past Medical History  Diagnosis Date  . Hypertension   . A-fib [facility])     Past Surgical History  Procedure Laterality Date  . Appendectomy    . Abdominal hysterectomy  1964  . Hernia repair Right 10/19/13    inguinal  . Femur im nail Right 09/16/2015    Procedure: INTRAMEDULLARY (IM) NAIL FEMORAL;  Surgeon: Thornton Park, MD;  Location: [facility] ORS;  Service: Orthopedics;  Laterality: Right;    Family History  Problem Relation Age of Onset  . Cervical cancer Mother   . Lung cancer Brother    Social History:  reports that she has never smoked. She has never used smokeless tobacco. She reports that she does not drink alcohol or use illicit drugs.  Allergies: No Known Allergies  Prior to Admission medications   Medication Sig Start Date End Date Taking? Authorizing Provider  citalopram (CELEXA) 10 MG tablet Take 10 mg by mouth daily. 03/01/15 02/29/16 Yes Historical Provider, MD  Fluticasone-Salmeterol (ADVAIR DISKUS) 100-50 MCG/DOSE AEPB Inhale 1 puff into the lungs 2 (two) times daily.   Yes Historical Provider, MD  furosemide (LASIX) 40 MG tablet Take 0.5 tablets (20 mg total) by mouth daily. 09/19/15  Yes Loletha Grayer, MD  metoprolol (LOPRESSOR) 100 MG tablet Take 1 tablet (100 mg total) by mouth 2 (two) times daily. 09/21/15  Yes Dustin Flock, MD  polyethylene glycol powder  (GLYCOLAX/MIRALAX) powder Take 17 g by mouth daily as needed. For constipation. Mis with 4-8oz of fluid.   Yes Historical Provider, MD  potassium chloride SA (K-DUR,KLOR-CON) 20 MEQ tablet Take 1 tablet (20 mEq total) by mouth daily. 09/19/15  Yes Loletha Grayer, MD  traMADol (ULTRAM) 50 MG tablet Take 0.5 tablets (25 mg total) by mouth every 8 (eight) hours as needed. Take at 8am, 2pm, and 8pm. 09/21/15  Yes Dustin Flock, MD     Results for orders placed or performed during the hospital encounter of 11/28/2015 (from the past 48 hour(s))  CBC     Status: Abnormal   Collection Time: 11/24/2015  9:37 AM  Result Value Ref Range   WBC 12.1 (H) 3.6 - 11.0 K/uL   RBC 4.34 3.80 - 5.20 MIL/uL   Hemoglobin 12.5 12.0 - 16.0 g/dL   HCT 39.3 35.0 - 47.0 %   MCV 90.5 80.0 - 100.0 fL   MCH 28.8 26.0 - 34.0 pg   MCHC 31.8 (L) 32.0 - 36.0 g/dL   RDW 14.2 11.5 - 14.5 %   Platelets 189 150 - 440 K/uL  Comprehensive metabolic panel     Status: Abnormal   Collection Time: 11/09/2015  9:37 AM  Result Value Ref Range   Sodium 141 135 - 145 mmol/L   Potassium 3.9 3.5 - 5.1 mmol/L   Chloride 104 101 - 111 mmol/L   CO2 29 22 - 32 mmol/L   Glucose, Bld 135 (H) 65 -  99 mg/dL   BUN 23 (H) 6 - 20 mg/dL   Creatinine, Ser 0.82 0.44 - 1.00 mg/dL   Calcium 9.4 8.9 - 10.3 mg/dL   Total Protein 6.9 6.5 - 8.1 g/dL   Albumin 4.1 3.5 - 5.0 g/dL   AST 26 15 - 41 U/L   ALT 15 14 - 54 U/L   Alkaline Phosphatase 120 38 - 126 U/L   Total Bilirubin 1.5 (H) 0.3 - 1.2 mg/dL   GFR calc non Af Amer >60 >60 mL/min   GFR calc Af Amer >60 >60 mL/min    Comment: (NOTE) The eGFR has been calculated using the CKD EPI equation. This calculation has not been validated in all clinical situations. eGFR's persistently <60 mL/min signify possible Chronic Kidney Disease.    Anion gap 8 5 - 15  Troponin I     Status: None   Collection Time: 11/22/2015  9:37 AM  Result Value Ref Range   Troponin I <0.03 <0.031 ng/mL    Comment:         NO INDICATION OF MYOCARDIAL INJURY.    Dg Chest 2 View  11/15/2015  CLINICAL DATA:  Hypoxemia with blood-tinged sputum. Evaluate pneumonia. History of COPD, congestive heart failure, dementia and atrial fibrillation. EXAM: CHEST  2 VIEW COMPARISON:  CT 10/16/2015.  Radiographs 09/15/2015. FINDINGS: The stable cardiomegaly. There is increased interstitial prominence with mild perihilar atelectasis on the right. There are small bilateral pleural effusions, similar to those demonstrated on CT 5 weeks ago. No confluent airspace opacity or pneumothorax. Several thoracic compression deformities are unchanged. IMPRESSION: Interstitial pulmonary edema with bilateral pleural effusions consistent with mild congestive heart failure. Electronically Signed   By: Richardean Sale M.D.   On: 11/13/2015 10:16    Review of Systems  Unable to perform ROS: dementia  Constitutional: Positive for malaise/fatigue.  Respiratory: Positive for shortness of breath.   Cardiovascular: Negative for chest pain.  Musculoskeletal: Positive for back pain.    Blood pressure 132/81, pulse 110, temperature 98.8 F (37.1 C), temperature source Oral, resp. rate 27, height _0  (1.575 m), weight 70.308 kg (155 lb), SpO2 98 %. Physical Exam  Nursing note and vitals reviewed. Constitutional: She is oriented to person, place, and time. She appears well-developed and well-nourished. No distress.  HENT:  Head: Normocephalic and atraumatic.  Mouth/Throat: Oropharynx is clear and moist.  Eyes: Conjunctivae and EOM are normal. Pupils are equal, round, and reactive to light. No scleral icterus.  Neck: Normal range of motion. Neck supple. JVD present. No tracheal deviation present. No thyromegaly present.  Cardiovascular: Normal heart sounds.  An irregular rhythm present. Tachycardia present.  Exam reveals no gallop and no friction rub.   No murmur heard. Respiratory: Tachypnea noted. She has rales in the right lower field and the  left lower field.  Nasal cannula in place  GI: Soft. Bowel sounds are normal. She exhibits no distension. There is no tenderness.  Genitourinary:  Deferred  Musculoskeletal: Normal range of motion. She exhibits edema (2+).  Lymphadenopathy:    She has no cervical adenopathy.  Neurological: She is alert and oriented to person, place, and time. No cranial nerve deficit. She exhibits normal muscle tone.  Skin: Skin is warm and dry. No rash noted. There is erythema.  Psychiatric: She has a normal mood and affect. Her behavior is normal.  Difficult to assess thought content and judgment due to dementia     Assessment/Plan This is an 81 year old Caucasian female admitted for  palliative care due to hypoxia secondary to vomiting edema. He also has end-stage COPD. 1. Hypoxia: We will supply supplemental oxygen as needed. Palliative care consult has been ordered and we will try to arrange placement at the inpatient hospice facility. 2. COPD: End-stage. Goal is comfort care and oxygen therapy as above. May continue inhaled corticosteroid for whatever comfort it may provide 3. Palliative care: We will manage pain and agitation 4. DVT prophylaxis: None as the patient is on comfort care 5. GI prophylaxis: None  The patient is a DO NOT RESUSCITATE. Time spent on admission orders and patient care approximately 45 minutes  Harrie Foreman 11/24/2015, 5:50 PM

## 2015-11-25 NOTE — Care Management Note (Signed)
Case Management Note  Patient Details  Name: Jeraldine LootsBetty G Pena MRN: [id number] Date of Birth: 05/09/1928  Subjective/Objective:     [facility] and they do not have a bed at present. Family for the pt. Have asked if pt. Could [facility] to [facility], as they don't desire treatment for the patient pulmonary edema at this time. Hospice Home has said they will have the on call RN assess the patient for a Hospice Home bed  [facility]    I have spoken to the patient and her daughter in the ER , and they have agreed with this plan.  Dr Cyril LoosenKinner and the RN for the patient made aware.           Action/Plan:   Expected Discharge Date:                  Expected Discharge Plan:     In-House Referral:     Discharge planning Services     Post Acute Care Choice:    Choice offered to:     DME Arranged:    DME Agency:     HH Arranged:    HH Agency:     Status of Service:     Medicare Important Message Given:    Date Medicare IM Given:    Medicare IM give by:    Date Additional Medicare IM Given:    Additional Medicare Important Message give by:     If discussed at Long Length of Stay Meetings, dates discussed:    Additional Comments:  Berna BueCheryl Lindley Stachnik, RN 10-Oct-2015, 10:59 AM

## 2015-11-25 NOTE — ED Notes (Signed)
Pt removed from 4L via [place] per MD's request. Pt's oxygen saturation dropped down to 87% before putting patient back on 4L.

## 2015-11-25 NOTE — ED Notes (Signed)
Family/POA at bedside. DNR on chart.

## 2015-11-26 DIAGNOSIS — I4891 Unspecified atrial fibrillation: Secondary | ICD-10-CM

## 2015-11-26 DIAGNOSIS — Z8781 Personal history of (healed) traumatic fracture: Secondary | ICD-10-CM

## 2015-11-26 DIAGNOSIS — Z515 Encounter for palliative care: Secondary | ICD-10-CM

## 2015-11-26 DIAGNOSIS — J9601 Acute respiratory failure with hypoxia: Secondary | ICD-10-CM

## 2015-11-26 DIAGNOSIS — F0280 Dementia in other diseases classified elsewhere without behavioral disturbance: Secondary | ICD-10-CM

## 2015-11-26 DIAGNOSIS — Z9181 History of falling: Secondary | ICD-10-CM

## 2015-11-26 DIAGNOSIS — K5900 Constipation, unspecified: Secondary | ICD-10-CM

## 2015-11-26 DIAGNOSIS — Z66 Do not resuscitate: Secondary | ICD-10-CM

## 2015-11-26 DIAGNOSIS — I5031 Acute diastolic (congestive) heart failure: Secondary | ICD-10-CM

## 2015-11-26 DIAGNOSIS — G301 Alzheimer's disease with late onset: Secondary | ICD-10-CM

## 2015-11-26 DIAGNOSIS — J449 Chronic obstructive pulmonary disease, unspecified: Secondary | ICD-10-CM

## 2015-11-26 DIAGNOSIS — I119 Hypertensive heart disease without heart failure: Secondary | ICD-10-CM

## 2015-11-26 MED ORDER — LORAZEPAM 0.5 MG PO TABS: 0.5000 mg | ORAL_TABLET | ORAL | Status: DC | PRN

## 2015-11-26 MED ORDER — LORAZEPAM 0.5 MG PO TABS: 0.5000 mg | ORAL_TABLET | ORAL | Status: AC | PRN

## 2015-11-26 MED ORDER — PROCHLORPERAZINE 25 MG RE SUPP: 25.0000 mg | Freq: Three times a day (TID) | RECTAL | Status: DC | PRN

## 2015-11-26 MED ORDER — ACETAMINOPHEN 325 MG PO TABS: 650.0000 mg | ORAL_TABLET | ORAL | Status: AC | PRN

## 2015-11-26 MED ORDER — HALOPERIDOL LACTATE 2 MG/ML PO CONC
2.0000 mg | Freq: Four times a day (QID) | ORAL | Status: AC | PRN
Start: 2015-11-26 — End: ?

## 2015-11-26 MED ORDER — PROCHLORPERAZINE 25 MG RE SUPP: 25.0000 mg | Freq: Three times a day (TID) | RECTAL | Status: AC | PRN

## 2015-11-26 MED ORDER — MORPHINE SULFATE (CONCENTRATE) 10 MG /0.5 ML PO SOLN: 5.0000 mg | ORAL | Status: AC | PRN

## 2015-11-26 MED ORDER — MORPHINE 100MG IN NS 100ML (1MG/ML) PREMIX INFUSION
2.0000 mg/h | INTRAVENOUS | Status: DC
  Administered 2015-11-26 – 2015-11-28 (×2): 2 mg/h via INTRAVENOUS
  Filled 2015-11-26 (×2): qty 100

## 2015-11-26 MED ORDER — LORAZEPAM 2 MG/ML IJ SOLN
1.0000 mg | INTRAMUSCULAR | Status: DC | PRN
  Administered 2015-11-27 – 2015-11-29 (×4): 1 mg via INTRAVENOUS
  Filled 2015-11-26 (×4): qty 1

## 2015-11-26 MED ORDER — GLYCOPYRROLATE 0.2 MG/ML IJ SOLN
0.4000 mg | Freq: Three times a day (TID) | INTRAMUSCULAR | Status: DC | PRN
  Administered 2015-11-27 – 2015-11-29 (×6): 0.4 mg via INTRAVENOUS
  Filled 2015-11-26 (×6): qty 2

## 2015-11-26 NOTE — Plan of Care (Signed)
Problem: Activity: Goal: Risk for activity intolerance will decrease Outcome: Not Progressing Patient is now comfort care. VSS. Morphine drip infusing. Patient has family at bedside.

## 2015-11-26 NOTE — Care Management Important Message (Signed)
Important Message  Patient Details  Name: Lydia [name] MRN: [id number] Date of Birth: 03/27/1928   Medicare Important Message Given:  Yes    Gwenette GreetBrenda S Avyonna Wagoner, RN 11/26/2015, 10:01 AM

## 2015-11-26 NOTE — Progress Notes (Addendum)
Palliative Care Update  Pt is seen by me today and I have straightened out the confusing story of how she got here and family's expectations of pt being transferred to [facility] and how that happened.    Pt is being evaluated currently by me and attending MD to determine if she seems to be one who is actively dying (within less than 2 to 3 weeks --as is the guideline for Hospice HOME).  Pt was recently admitted under [facility] at [facility].  Family found pt to be very hypoxic there yesterday and it was communicated to family that [facility] did not have the capability of treating pt with oxygen there.  That information turned out to be less than accurate, of course, but this does not matter because family does NOT want pt to go back to [facility] and they have already given their notice there.    Family had thought pt was definitely going to [facility], based on communications they got from the ED and Admitting MDs.  Now, it seems pt is needing to be evaluated to determine how terminal she might be.  If she is found to be someone actively dying, then [place] is appropriate.  I think this will be the case.   If, however, she seems to be somewhat more stable than that, she would need LTC placement in a SNF (not under rehab days but under out of pocket payments by family same as they were doing at [facility] but a bit more expensive) [place] care continuing in that setting.   Pt is quite hypoxic and family only wants comfort meds which I talked with them about.  I have talked to the attending and she is OK with me changing meds to comfort only types of meds (and no labs etc etc) as discussed with family when we met.   See full note to follow.  Time spent 80 min so far.   Colleen Can, MD

## 2015-11-26 NOTE — Progress Notes (Signed)
Dr Orvan Falconerampbell D/C air mattress

## 2015-11-26 NOTE — Plan of Care (Signed)
Problem: Education: Goal: Knowledge of [facility] Education information/materials will improve Outcome: Progressing Education provided on current plan of care regarding palliative consult. Pt is alert, oriented x2. Pt daughter, Lydia [name] at bedside involved in care. Lydia [name] verbalized understanding.  Problem: Safety: Goal: Ability to remain free from injury will improve Outcome: Progressing High fall risk. +[facility] Bed alarm on, hourly rounding. Daughter at bedside through the night. Safe environment provided.   Problem: Pain Managment: Goal: General experience of comfort will improve Outcome: Progressing C/o generalized pain relieved by PRN Morphine x1 w/ noted relief.  Problem: Activity: Goal: Risk for activity intolerance will decrease Outcome: Progressing Oxygen saturation stable on 5L. SOB w/ exertion noted. Rest period provided.

## 2015-11-26 NOTE — Consult Note (Addendum)
Palliative Medicine Inpatient Consult Note   Name: Lydia [name] Date: 11/26/2015 MRN: [id number]  DOB: Apr 23, 1928  Referring Physician: Gladstone Lighter, MD  Palliative Care consult requested for this 81 y.o. female for goals of medical therapy in patient with acute respiratory failure due to CHF and hypertensive heart disease.   TODAY'S DISCUSSION AND DECISIONS: Pt is seen by me today and I have straightened out the confusing story of how she got here and family's expectations of pt being transferred to [facility] and how that happened.   Pt is being evaluated currently by me and attending MD to determine if she seems to be one who is actively dying (within less than 2 to 3 weeks --as is the guideline for Hospice HOME). Pt was recently admitted under [place] at [facility]. Family found pt to be very hypoxic there yesterday and it was communicated to family that [facility] did not have the capability of treating pt with oxygen there. That information turned out to be less than accurate, of course, but this does not matter because family does NOT want pt to go back to [facility] and they have already given their notice there.   Family had thought pt was definitely going to [facility], based on communications they got from the ED and Admitting MDs.    Pt is quite hypoxic and family only wants comfort meds which I talked with them about. I have talked to the attending and she is OK with me changing meds to comfort only types of meds (and no labs etc etc) as discussed with family when we met.   At first, patient seemed to be doing a bit better b/c she got some lasix while here.  But then she had a profound and lasting hypoxic episode where she was in quite a bit of distress for over an hours. Oxygen was increased to 5 LPM (no higher ) and she was given Roxanol.  Family clearly wants ONLY comfort meds as pt has suffered for too long and keeps asking daughters to allow her to  die).  Pt has Afib RVR and it not to get any more rate controlling meds as the approach is comfort and not treatment or cure.    I have ordered a morphine drip BUT I am only going to put Roxanol on her DC meds as it is not definite that she would need the morphine drip after a day or two.  It is started b/c of this hypoxic episode --but it is possible that she might be able to just get fairly frequent Roxanol at the [facility] (so I will order that--and not the morphine drip ).  I will not be here the rest of the week (I will return from my holiday break on Jan 3rd). I have discussed this with Hospice Liaison, nursing, care mgmt, social worker, family, and attending.    Also, i suspect that there is an infectious componenet given that pt is coughing up bloody phlegm. She has  End-stage COPD  (though she is listed as never having smoked).     IMPRESSION: Acute Diastolic CHF ---on chronic ---with profound hypoxia ----now needing 5 LPM oxygen (or more -not going up on this) AFib with RVR --rate was 140-150  ----No rate control desired  ----HR varies and is normal at times but very fast at other times. Cough with bloody sputum reported (suspected hemoptysis due to a bronchitis or possibly an early pneumonia)  Also:  End  Stage COPD with hypoxia ---DNR and DNI and no BIPAP and No Hi Flow Oxgyen is desired HTN Advanced Dementia of Alz type Fractured Femur in past Falls Constipation   Pain in joints    REVIEW OF SYSTEMS:  Patient is not able to provide ROS due to dementia  SPIRITUAL SUPPORT SYSTEM: Yes  SOCIAL HISTORY:  reports that she has never smoked. She has never used smokeless tobacco. She reports that she does not drink alcohol or use illicit drugs.  LEGAL DOCUMENTS:  Daughter, Lydia [name], is to bring in the [facility] form for the paper chart listing her as HCPOA.   CODE STATUS: DNR  PAST MEDICAL HISTORY: Past Medical History  Diagnosis Date  . Hypertension   . A-fib [facility])      PAST SURGICAL HISTORY:  Past Surgical History  Procedure Laterality Date  . Appendectomy    . Abdominal hysterectomy  1964  . Hernia repair Right 10/19/13    inguinal  . Femur im nail Right 09/16/2015    Procedure: INTRAMEDULLARY (IM) NAIL FEMORAL;  Surgeon: Thornton Park, MD;  Location: [facility] ORS;  Service: Orthopedics;  Laterality: Right;    ALLERGIES:  has No Known Allergies.  MEDICATIONS:  Current Facility-Administered Medications  Medication Dose Route Frequency Provider Last Rate Last Dose  . acetaminophen (TYLENOL) tablet 650 mg  650 mg Oral Q6H PRN Harrie Foreman, MD   650 mg at 11/22/2015 4982   Or  . acetaminophen (TYLENOL) suppository 650 mg  650 mg Rectal Q6H PRN Harrie Foreman, MD      . glycopyrrolate (ROBINUL) injection 0.4 mg  0.4 mg Intravenous TID PRN Colleen Can, MD      . haloperidol (HALDOL) 2 MG/ML solution 0.5 mg  0.5 mg Sublingual Q4H PRN Harrie Foreman, MD   0.5 mg at 11/26/15 6415   Or  . haloperidol lactate (HALDOL) injection 0.5 mg  0.5 mg Intravenous Q4H PRN Harrie Foreman, MD      . LORazepam (ATIVAN) injection 1 mg  1 mg Intravenous Q4H PRN Colleen Can, MD      . LORazepam (ATIVAN) tablet 0.5 mg  0.5 mg Oral Q4H PRN Colleen Can, MD      . morphine 198m in NS 1024m(24m76mL) infusion - premix  2 mg/hr Intravenous Continuous MarColleen CanD 2 mL/hr at 11/26/15 1330 2 mg/hr at 11/26/15 1330  . ondansetron (ZOFRAN) injection 4 mg  4 mg Intravenous Q6H PRN MicHarrie ForemanD      . prochlorperazine (COMPAZINE) suppository 25 mg  25 mg Rectal Q8H PRN MarColleen CanD        Vital Signs: BP 126/78 mmHg  Pulse 140  Temp(Src) 98.8 F (37.1 C) (Oral)  Resp 18  Ht _0  (1.575 m)  Wt 70.308 kg (155 lb)  BMI 28.34 kg/m2  SpO2 99% Filed Weights   11/21/2015 0923  Weight: 70.308 kg (155 lb)    Estimated body mass index is 28.34 kg/(m^2) as calculated from the following:   Height as of this  encounter: _1  (1.575 m).   Weight as of this encounter: 70.308 kg (155 lb).  PERFORMANCE STATUS (ECOG) : 3 - Symptomatic, >50% confined to bed  --bedbound now most likely  PHYSICAL EXAM: Pt was seen twice this am The first time, she was talking but not making much sense (but confabulating) Then, she had somewhat clear lungs but a very irregular heart rhythm.  Later, she was in  distress --acute respiratory distress --gasping for air and restless until the morphine given her took effect. She was also given Haldol This took an hour for her to get more rested and free of air hunger.    Abd soft and NT Ext no cyanosis or mottling as yet Skin is very dry She is confused but able to confabulate. Able to sit on side of bed and maintain posture earlier --not now.   LABS: CBC:    Component Value Date/Time   WBC 12.1* 11/06/2015 0937   WBC 4.8 02/20/2015 0500   HGB 12.5 11/06/2015 0937   HGB 11.6* 02/20/2015 0500   HCT 39.3 11/06/2015 0937   HCT 37.6 02/20/2015 0500   PLT 189 11/13/2015 0937   PLT 155 02/20/2015 0500   MCV 90.5 11/10/2015 0937   MCV 91 02/20/2015 0500   NEUTROABS 4.8 10/29/2015 0810   NEUTROABS 3.2 02/20/2015 0500   LYMPHSABS 0.9* 10/29/2015 0810   LYMPHSABS 1.0 02/20/2015 0500   MONOABS 0.7 10/29/2015 0810   MONOABS 0.6 02/20/2015 0500   EOSABS 0.1 10/29/2015 0810   EOSABS 0.1 02/20/2015 0500   BASOSABS 0.0 10/29/2015 0810   BASOSABS 0.0 02/20/2015 0500   Comprehensive Metabolic Panel:    Component Value Date/Time   NA 141 11/08/2015 0937   NA 138 02/20/2015 0500   K 3.9 11/20/2015 0937   K 4.0 02/20/2015 0500   CL 104 11/01/2015 0937   CL 105 02/20/2015 0500   CO2 29 11/07/2015 0937   CO2 27 02/20/2015 0500   BUN 23* 10/31/2015 0937   BUN 29* 02/20/2015 0500   CREATININE 0.82 11/23/2015 0937   CREATININE 0.86 02/21/2015 0437   GLUCOSE 135* 11/01/2015 0937   GLUCOSE 102* 02/20/2015 0500   CALCIUM 9.4 11/14/2015 0937   CALCIUM 9.0 02/20/2015 0500    AST 26 11/22/2015 0937   ALT 15 11/22/2015 0937   ALKPHOS 120 11/24/2015 0937   BILITOT 1.5* 11/24/2015 0937   PROT 6.9 11/02/2015 0937   ALBUMIN 4.1 11/18/2015 0937    TESTS  ECHO 08/2015: - Left ventricle: The cavity size was normal. Systolic function was normal. The estimated ejection fraction was in the range of 60% to 65%. Regional wall motion abnormalities cannot be excluded. - Aortic valve: Valve area (Vmax): 1.69 cm^2. - Right ventricle: The cavity size was moderately dilated. Wall thickness was normal. - Tricuspid valve: There was moderate regurgitation.    REFERRALS TO BE ORDERED:  Hospice for Hospice Home consideration   More than 50% of the visit was spent in counseling/coordination of care: Yes  Time Spent: 55 minutes

## 2015-11-26 NOTE — Progress Notes (Signed)
Visit made. Patient is currently followed by [facility] [place] [facility] with a hospice diagnosis of hypertensive heart disease. She is a DNR code. °Patient to the [facility] ED on 12/26 for evaluation of cough and blood in her sputum. She was admitted for treatment of hypoxia and has required increasing amount of oxygen, currently on 5 liters. Family was under the impression that patient would be moved to the hospice home today but there is no bed availability. Family has met with Dr. Campbell and would like symptom management only, no IV antibiotics or invasive procedures. She is to be placed on the hospice home waiting list. °Patient seen lying in bed, alert, dyspnea with any exertion. She was able to interact with writer, at her baseline she is oriented self and familiars/family. Writer spoke with patient's daughter Deborah, present in the room, who confirmed the choice of hospice home. Dr. Campbell feels that patient does meet qualifications and is planning on starting a morphine drip to help with patient's dyspnea. Updated information to be faxed to referral. Will continue to follow through final disposition.  °Karen Robertson RN, BSN, CHPN °[facility] [place], hospital Liaison °[phone] c °

## 2015-11-26 NOTE — Progress Notes (Signed)
[facility] - [place] at [facility]   PATIENT NAME: Lydia [name]    MR#:  [id number]  DATE OF BIRTH:  1928/05/12  SUBJECTIVE:  CHIEF COMPLAINT:   Chief Complaint  Patient presents with  . Shortness of Breath   -Patient admitted from [facility]. Daughters at bedside. She came in with diastolic CHF with respiratory failure, on 5 L oxygen. -Family and patient want comfort care. -Appreciate palliative care input. She is placed on morphine drip. Awaiting hospice home bed.  REVIEW OF SYSTEMS:  Review of Systems  Unable to perform ROS: critical illness    DRUG ALLERGIES:  No Known Allergies  VITALS:  Blood pressure 103/72, pulse 152, temperature 98.8 F (37.1 C), temperature source Oral, resp. rate 20, height  (1.575 m), weight 70.308 kg (155 lb), SpO2 99 %.  PHYSICAL EXAMINATION:  Physical Exam  GENERAL:  81 y.o.-year-old patient lying in the bed, with tachypnea and also very anxious when aroused.Marland Kitchen  EYES: Pupils equal, round, reactive to light and accommodation. No scleral icterus. Extraocular muscles intact.  HEENT: Head atraumatic, normocephalic. Oropharynx and nasopharynx clear.  NECK:  Supple, no jugular venous distention. No thyroid enlargement, no tenderness.  LUNGS: Tachypneic, using accessory muscles to breathe. Moving air bilaterally, bibasilar rhonchi and crackles heard. Scattered wheezing heard.  CARDIOVASCULAR: S1, S2 normal. No  rubs, or gallops. 3/6 systolic murmur is present ABDOMEN: Soft, nontender, nondistended. Bowel sounds present. No organomegaly or mass.  EXTREMITIES: No pedal edema, cyanosis, or clubbing.  NEUROLOGIC: Patient is arousable. Did not do a complete neuro exam. She is able to move all 4 extremities in bed. Generalized weakness noted. PSYCHIATRIC: The patient is alert and answering simple questions. Falling right back to sleep. Not oriented.Marland Kitchen  SKIN: No obvious rash, lesion, or ulcer.    LABORATORY PANEL:    CBC  Recent Labs Lab 2015/12/19 0937  WBC 12.1*  HGB 12.5  HCT 39.3  PLT 189   ------------------------------------------------------------------------------------------------------------------  Chemistries   Recent Labs Lab 12/19/2015 0937  NA 141  K 3.9  CL 104  CO2 29  GLUCOSE 135*  BUN 23*  CREATININE 0.82  CALCIUM 9.4  AST 26  ALT 15  ALKPHOS 120  BILITOT 1.5*   ------------------------------------------------------------------------------------------------------------------  Cardiac Enzymes  Recent Labs Lab 12/19/15 0937  TROPONINI <0.03   ------------------------------------------------------------------------------------------------------------------  RADIOLOGY:  Dg Chest 2 View  2015/12/19  CLINICAL DATA:  Hypoxemia with blood-tinged sputum. Evaluate pneumonia. History of COPD, congestive heart failure, dementia and atrial fibrillation. EXAM: CHEST  2 VIEW COMPARISON:  CT 10/16/2015.  Radiographs 09/15/2015. FINDINGS: The stable cardiomegaly. There is increased interstitial prominence with mild perihilar atelectasis on the right. There are small bilateral pleural effusions, similar to those demonstrated on CT 5 weeks ago. No confluent airspace opacity or pneumothorax. Several thoracic compression deformities are unchanged. IMPRESSION: Interstitial pulmonary edema with bilateral pleural effusions consistent with mild congestive heart failure. Electronically Signed   By: Carey Bullocks M.D.   On: Dec 19, 2015 10:16    EKG:   Orders placed or performed during the hospital encounter of 12/19/15  . ED EKG  . ED EKG  . EKG 12-Lead  . EKG 12-Lead  . ED EKG  . ED EKG    ASSESSMENT AND PLAN:   81 year old elderly female with past medical history significant for dementia, hypertension, atrial fibrillation, recent right hip surgery from [facility]  memory care unit presents to the hospital secondary to respiratory distress.  She has the following  diagnoses:  1 acute hypoxic respiratory failure #2 acute on chronic diastolic congestive heart failure exacerbation #3 hypertension #4 atrial fibrillation #5 dementia #6 history of depression.  Patient still dementia has been getting worse according to family. She was not using any home oxygen at the assisted living facility. She came in with difficulty breathing, was hypoxic now on 5 L of oxygen. Gets very anxious and also tachypneic. She also has been having some hemoptysis consistent with maybe bronchitis. However patient and family have decided not to be aggressive in her treatment. Opted for comfort care. Appreciate palliative care consult. Family is very interested for the patient to go to hospice home. However no beds available at hospice home currently. -Continue morphine drip, Ativan as needed here in the hospital. If she is stable tomorrow and beds become available at hospice home, patient can be transferred.    All the records are reviewed and case discussed with Care Management/Social Workerr. Management plans discussed with the patient, family and they are in agreement.  CODE STATUS: DNR  TOTAL TIME TAKING CARE OF THIS PATIENT: 25 minutes.   POSSIBLE D/C IN 1 DAY, DEPENDING ON CLINICAL CONDITION.   Enid BaasKALISETTI,Karlye Ihrig M.D on 11/26/2015 at 2:21 PM  Between 7am to 6pm - Pager - [phone]  After 6pm go to [email or web address] - password [email or web address] [facility]  [facility]  Office  [phone]  CC: Primary care physician; Clydie BraunFITZGERALD, DAVID, MD

## 2015-11-26 NOTE — Clinical Social Work Note (Signed)
CS[facility] Pt will not be returning to ALF. Pt has been made comfort care. [facility] when bed is available and is stable for transfer. CSW will continue to follow.   Dede QuerySarah Aydian Dimmick, MSW, LCSW Clinical Social Worker  [phone]

## 2015-11-27 MED ORDER — MORPHINE SULFATE (PF) 2 MG/ML IV SOLN
1.0000 mg | INTRAVENOUS | Status: DC | PRN
  Administered 2015-11-27 – 2015-11-29 (×10): 1 mg via INTRAVENOUS
  Filled 2015-11-27 (×11): qty 1

## 2015-11-27 NOTE — Progress Notes (Signed)
[facility] Physicians - [place] at [facility]   PATIENT NAME: Lydia [name]    MR#:  [id number]  DATE OF BIRTH:  July 05, 1928  SUBJECTIVE: Patient is on comfort care. Having apnea episodes. On morphine drip. Family at bedside.   CHIEF COMPLAINT:   Chief Complaint  Patient presents with  . Shortness of Breath   -Patient admitted from [facility]. Daughters at bedside. She came in with diastolic CHF with respiratory failure, on 5 L oxygen. -Family and patient want comfort care. -Appreciate palliative care input.  REVIEW OF SYSTEMS:  Review of Systems  Unable to perform ROS: critical illness    DRUG ALLERGIES:  No Known Allergies  VITALS:  Blood pressure 126/86, pulse 164, temperature 98 F (36.7 C), temperature source Oral, resp. rate 24, height  (1.575 m), weight 70.308 kg (155 lb), SpO2 98 %.  PHYSICAL EXAMINATION:  Physical Exam  GENERAL:  81 y.o.-year-old patient lying in the bed, with tachypnea and also very anxious when aroused.Marland Kitchen  EYES: Pupils equal, round, reactive to light and accommodation. No scleral icterus. Extraocular muscles intact.  HEENT: Head atraumatic, normocephalic. Oropharynx and nasopharynx clear.  NECK:  Supple, no jugular venous distention. No thyroid enlargement, no tenderness.  LUNGS: Tachypneic, using accessory muscles to breathe. Moving air bilaterally, bibasilar rhonchi and crackles heard. Scattered wheezing heard.  CARDIOVASCULAR: S1, S2 normal. No  rubs, or gallops. 3/6 systolic murmur is present ABDOMEN: Soft, nontender, nondistended. Bowel sounds present. No organomegaly or mass.  EXTREMITIES: No pedal edema, cyanosis, or clubbing.  NEUROLOGIC: Patient is arousable. Did not do a complete neuro exam. She is able to move all 4 extremities in bed. Generalized weakness noted. PSYCHIATRIC: The patient is alert and answering simple questions. Falling right back to sleep. Not oriented.Marland Kitchen  SKIN: No obvious rash, lesion, or ulcer.     LABORATORY PANEL:   CBC  Recent Labs Lab 11/09/2015 0937  WBC 12.1*  HGB 12.5  HCT 39.3  PLT 189   ------------------------------------------------------------------------------------------------------------------  Chemistries   Recent Labs Lab 11/01/2015 0937  NA 141  K 3.9  CL 104  CO2 29  GLUCOSE 135*  BUN 23*  CREATININE 0.82  CALCIUM 9.4  AST 26  ALT 15  ALKPHOS 120  BILITOT 1.5*   ------------------------------------------------------------------------------------------------------------------  Cardiac Enzymes  Recent Labs Lab 11/27/2015 0937  TROPONINI <0.03   ------------------------------------------------------------------------------------------------------------------  RADIOLOGY:  Dg Chest 2 View  11/09/2015  CLINICAL DATA:  Hypoxemia with blood-tinged sputum. Evaluate pneumonia. History of COPD, congestive heart failure, dementia and atrial fibrillation. EXAM: CHEST  2 VIEW COMPARISON:  CT 10/16/2015.  Radiographs 09/15/2015. FINDINGS: The stable cardiomegaly. There is increased interstitial prominence with mild perihilar atelectasis on the right. There are small bilateral pleural effusions, similar to those demonstrated on CT 5 weeks ago. No confluent airspace opacity or pneumothorax. Several thoracic compression deformities are unchanged. IMPRESSION: Interstitial pulmonary edema with bilateral pleural effusions consistent with mild congestive heart failure. Electronically Signed   By: Carey Bullocks M.D.   On: 11/27/2015 10:16    EKG:   Orders placed or performed during the hospital encounter of 11/24/2015  . ED EKG  . ED EKG  . EKG 12-Lead  . EKG 12-Lead  . ED EKG  . ED EKG    ASSESSMENT AND PLAN:   81 year old elderly female with past medical history significant for dementia, hypertension, atrial fibrillation, recent right hip surgery from [facility]  memory care unit presents to the hospital secondary to respiratory distress.  She  has  the following diagnoses:  1 acute hypoxic respiratory failure #2 acute on chronic diastolic congestive heart failure exacerbation #3 hypertension #4 atrial fibrillation #5 dementia #6 history of depression.  And a new finding it is oxygen, morphine drip, Ativan. Continue comfort care measures. Family thinks that she is having more apnea spells and she may die here very soon. So continue to monitor for terminal events. May not go to hospice home. Because she is having apnea spells and she may pass here.    All the records are reviewed and case discussed with Care Management/Social Workerr. Management plans discussed with the patient, family and they are in agreement.  CODE STATUS: DNR  TOTAL TIME TAKING CARE OF THIS PATIENT: 25 minutes.   POSSIBLE D/C IN 1 DAY, DEPENDING ON CLINICAL CONDITION.   Katha HammingKONIDENA,Lorianna Spadaccini M.D on 11/27/2015 at 9:49 AM  Between 7am to 6pm - Pager - [phone]  After 6pm go to [email or web address] - password [email or web address]  [facility]  Office  [phone]  CC: Primary care physician; Clydie BraunFITZGERALD, DAVID, MD

## 2015-11-27 NOTE — Plan of Care (Signed)
Problem: Activity: Goal: Risk for activity intolerance will decrease Outcome: Progressing Pt on comfort care measures. Glycopyrrolate for secretions with relief. Family at bedside. Possible d/c to hospice home when bed available.continue to monitor.

## 2015-11-27 NOTE — Progress Notes (Signed)
Visit made. Patient seen lying in bed, respirations shallow, uneven. Daughters at bedside. Patient remains on morphine drip at 2 mg/hour continuous, she is minimally responsive. Family reports she mumbled a few words earlier in the morning and has not spoken since. She does appear to be actively dying. Family updated as to no bed availability at the hospice home.  Family has requested that patient remain here and not be moved as they feel that she is being kept comfortable and is very "near her end". Emotional support offered.  Dayna BarkerKaren Robertson RN, BSN, [facility] [place], [facility] L[facility] [phone] c

## 2015-11-27 NOTE — Plan of Care (Signed)
Problem: Activity: Goal: Risk for activity intolerance will decrease Outcome: Progressing Patient is unresponsive. Morphine drip infusing. No indications of pain. Family at bedside.

## 2015-11-28 NOTE — Care Management Important Message (Signed)
Important Message  Patient Details  Name: Lydia [name] MRN: [id number] Date of Birth: 05/26/1928   Medicare Important Message Given:  Yes    Olegario MessierKathy A Natsha Guidry 11/28/2015, 10:22 AM

## 2015-11-28 NOTE — Progress Notes (Signed)
[facility] -  at [facility]   PATIENT NAME: Lydia [name]    MR#:  [id number]  DATE OF BIRTH:  June 19, 1928  SUBJECTIVE: Patient is on comfort care. Today has fever  upto 102F.Tahcy with HR 174. Having apnea episodes. On morphine drip. Family at bedside.   CHIEF COMPLAINT:   Chief Complaint  Patient presents with  . Shortness of Breath   -Patient admitted from [facility]. Daughters at bedside. She came in with diastolic CHF with respiratory failure, on 5 L oxygen. -Family and patient want comfort care. -Appreciate palliative care input.  REVIEW OF SYSTEMS:  Review of Systems  Unable to perform ROS: critical illness    DRUG ALLERGIES:  No Known Allergies  VITALS:  Blood pressure 108/58, pulse 174, temperature 102.7 F (39.3 C), temperature source Rectal, resp. rate 20, height  (1.575 m), weight 70.308 kg (155 lb), SpO2 85 %.  PHYSICAL EXAMINATION:  Physical Exam  GENERAL:  81 y.o.-year-old patient lying in the bed, with tachypnea and also very anxious when aroused.Marland Kitchen  EYES: Pupils equal, round, reactive to light and accommodation. No scleral icterus. Extraocular muscles intact.  HEENT: Head atraumatic, normocephalic. Oropharynx and nasopharynx clear.  NECK:  Supple, no jugular venous distention. No thyroid enlargement, no tenderness.  LUNGS: Tachypneic, using accessory muscles to breathe. Moving air bilaterally, bibasilar rhonchi and crackles heard. Scattered wheezing heard.  CARDIOVASCULAR: S1, S2 normal. No  rubs, or gallops. 3/6 systolic murmur is present ABDOMEN: Soft, nontender, nondistended. Bowel sounds present. No organomegaly or mass.  EXTREMITIES: No pedal edema, cyanosis, or clubbing.  NEUROLOGIC: Patient is arousable. Did not do a complete neuro exam. She is able to move all 4 extremities in bed. Generalized weakness noted. PSYCHIATRIC: The patient is alert and answering simple questions. Falling right back to sleep. Not  oriented.Marland Kitchen  SKIN: No obvious rash, lesion, or ulcer.    LABORATORY PANEL:   CBC  Recent Labs Lab 11/15/2015 0937  WBC 12.1*  HGB 12.5  HCT 39.3  PLT 189   ------------------------------------------------------------------------------------------------------------------  Chemistries   Recent Labs Lab 29-Nov-2015 0937  NA 141  K 3.9  CL 104  CO2 29  GLUCOSE 135*  BUN 23*  CREATININE 0.82  CALCIUM 9.4  AST 26  ALT 15  ALKPHOS 120  BILITOT 1.5*   ------------------------------------------------------------------------------------------------------------------  Cardiac Enzymes  Recent Labs Lab 11/01/2015 0937  TROPONINI <0.03   ------------------------------------------------------------------------------------------------------------------  RADIOLOGY:  No results found.  EKG:   Orders placed or performed during the hospital encounter of 10/31/2015  . ED EKG  . ED EKG  . EKG 12-Lead  . EKG 12-Lead  . ED EKG  . ED EKG    ASSESSMENT AND PLAN:   81 year old elderly female with past medical history significant for dementia, hypertension, atrial fibrillation, [facility] presents to the hospital secondary to respiratory distress.  She has the following diagnoses:  1 acute hypoxic respiratory failure #2 acute on chronic diastolic congestive heart failure exacerbation #3 hypertension #4 atrial fibrillation #5 dementia #6 history of depression.  And a new finding it is oxygen, morphine drip, Ativan. Continue comfort care measures.  unstable go to hospice home because of the fever and tachycardia.  All the records are reviewed and case discussed with Care Management/Social Workerr. Management plans discussed with the patient, family and they are in agreement.  CODE STATUS: DNR  TOTAL TIME TAKING CARE OF THIS PATIENT: 25 minutes.   POSSIBLE  D/C IN 1 DAY, DEPENDING ON CLINICAL CONDITION.   Katha HammingKONIDENA,Jasiri Hanawalt  M.D on 11/28/2015 at 1:33 PM  Between 7am to 6pm - Pager - [phone]  After 6pm go to [email or web address] - password [email or web address] [facility]  [facility] [place] [facility]  Office  [phone]  CC: Primary care physician; Clydie BraunFITZGERALD, DAVID, MD

## 2015-11-28 NOTE — Progress Notes (Signed)
Visit made. Patient lying in bed, son Trey PaulaJeff at bedside. Respirations very shallow and uneven. Patient with elevated temperature of 102 this morning and heart rate of 174. Acetaminophen given for temperature. Patient has also required medications for secretion management and a bolus dose of  Iv morphine 1 mg in addition to her continuous rate of 2 mg an hour. Writer spoke with patient's son Trey PaulaJeff to advise that there is a bed available at the Hospice home. Family wishes for patient to remain here at the hospital as she does not appear to be stable enough for transport. Hospital Care team made aware. Thank you. Dayna BarkerKaren Robertson RN, BSN, [facility] [place], [facility] Liaison [phone] c

## 2015-11-28 NOTE — Plan of Care (Addendum)
Pt remains unresponsive. Rigid neck and extremities. Family at bedside through the night.   Problem: Safety: Goal: Ability to remain free from injury will improve Outcome: Progressing Bed alarm on, daughter at bedside. Save environment provided.   Problem: Pain Managment: Goal: General experience of comfort will improve Outcome: Progressing Morphine drip 2mg /hr continues as ordered, additional PRN Morphine needed x2 w/ noted relief. Pain control measures explained to pt daughter at bedside who is very concerned at ensuring pt is as comfortable as possible.

## 2015-11-28 NOTE — Plan of Care (Signed)
Problem: Education: Goal: Knowledge of [facility] Education information/materials will improve Outcome: Completed/Met Date Met:  11/28/15 Pt and family aware of comfort measures, support given.

## 2015-12-01 NOTE — Plan of Care (Signed)
Pt remains unresponsive. Family at bedside through the night. Comfort measures provided.   Problem: Safety: Goal: Ability to remain free from injury will improve Outcome: Progressing Bed alarm on, daughter at bedside. Save environment provided.   Problem: Pain Managment: Goal: General experience of comfort will improve Outcome: Progressing Morphine drip 2mg /hr continues as ordered, additional PRN Morphine needed w/ noted relief.

## 2015-12-01 NOTE — Progress Notes (Signed)
Called to room at 1310  Family at bedside/ pt without signs of life/ no resp/ pulse or b/p. Pt prounounced expired at 1312. [place] donor notified and  Declined pt.  md notified.  AC notified.  Second rn to prounounce was d. heslep rn.   Family consoled.  Post mortem care.  Sl d/cd. 02 d/cd.  [facility]

## 2015-12-01 NOTE — Progress Notes (Signed)
Nutrition Brief Note  Chart reviewed. Pt now transitioning to comfort care.  No further nutrition interventions warranted at this time.  Please re-consult as needed.   Aparna Vanderweele, RD, LDN Pager [phone] Weekend/On-Call Pager [phone]   

## 2015-12-01 NOTE — Clinical Social Work Note (Signed)
Pt is comfort care. Pt is unstable for transfer. CSW is available for support if needed. CSW will continue to follow.   Lydia QuerySarah Trixie Maclaren, MSW, LCSW Clinical Social Worker [phone]

## 2015-12-01 NOTE — Clinical Social Work Note (Signed)
Pt expired today at 1312. CSW is signing off as no further needs identified.   Dede QuerySarah Arnecia Ector, MSW, LCSW Clincial Social Worker [phone]

## 2015-12-01 NOTE — Progress Notes (Signed)
Visit made. Patient remains on comfort care. Blood tinged secretions noted this morning, mouth care provided. Patient with increased secretions and elevated temperature of 103 axillary. Writer spoke with patient's son Trey Paulajeff and dauhgter in law Vernona RiegerLaura, present at bedside. They agreed for patient to have a tylenol suppository and to be repositioned. Staff RN's Baltazar NajjarWanette and Lupita LeashDonna made aware. Patient repositioned and gown changed, she continued with increased work of breathing, currently on 2 mg of continuous IV morphine with last bolus dose of 1 mg given at 9:35 this morning. Family requests a bolus to be given, staff RN Baltazar NajjarWanette advised. Will continue to follow and provide support.  Dayna BarkerKaren Robertson RN, BSN, [facility] [place], [facility]ia[facility] [phone] c

## 2015-12-01 NOTE — Discharge Summary (Signed)
Death Note please see Last Note for all details.   In breif -82-year-old female patient with history of significant dementia, hypertension, chronic atrial fibrillation, recent right hip surgery brought in from bed to her memory care unit secondary to respiratory distress. Patient has end stage  COPD followed by the hospice. Patient and the patient daughters requested comfort care measures. Patient started on morphine drip, Ativan. Followed by hospice Patient continued to have fever 103, tachycardia. Patient's respirations became shallow knee bend. And the patient's family did not move the patient to hospice home. Emotional support offered. Patient expired at 1:12 PM. Cause of death is 1.acute on chronic respiratory failure ,   2.Endstage COPD 3,acute chronic diastolic chf 4.advanced Dementia    Belen Pesch CSN:[id number],MRN:[id number] is a 82 y.o. female, Outpatient Primary MD for the patient is FITZGERALD, DAVID, MD  Pronounced dead by RN * on    December 17, 2023    @        1.12 pm,            Cause of death ;1.acute on chronic respiratory failure ,   2.Endstage COPD 3,acute chronic diastolic chf 4.advanced Dementia   Total clinical and documentation time for today more than 30 min  Last Note. SUBJECTIVE: Patient is on comfort care. Today has fever upto 102F.Tahcy with HR 174. Having apnea episodes. On morphine drip. Family at bedside.   CHIEF COMPLAINT:  Chief Complaint  Patient presents with  . Shortness of Breath   -Patient admitted from [facility] Daughters at bedside. She came in with diastolic CHF with respiratory failure, on 5 L oxygen. -Family and patient want comfort care. -Appreciate palliative care input.  REVIEW OF SYSTEMS:  Review of Systems  Unable to perform ROS: critical illness    DRUG ALLERGIES:  No Known Allergies  VITALS:  Blood pressure 108/58, pulse 174,  temperature 102.7 F (39.3 C), temperature source Rectal, resp. rate 20, height  (1.575 m), weight 70.308 kg (155 lb), SpO2 85 %.  PHYSICAL EXAMINATION:  Physical Exam  GENERAL: 82 y.o.-year-old patient lying in the bed, with tachypnea and also very anxious when aroused.Marland Kitchen  EYES: Pupils equal, round, reactive to light and accommodation. No scleral icterus. Extraocular muscles intact.  HEENT: Head atraumatic, normocephalic. Oropharynx and nasopharynx clear.  NECK: Supple, no jugular venous distention. No thyroid enlargement, no tenderness.  LUNGS: Tachypneic, using accessory muscles to breathe. Moving air bilaterally, bibasilar rhonchi and crackles heard. Scattered wheezing heard.  CARDIOVASCULAR: S1, S2 normal. No rubs, or gallops. 3/6 systolic murmur is present ABDOMEN: Soft, nontender, nondistended. Bowel sounds present. No organomegaly or mass.  EXTREMITIES: No pedal edema, cyanosis, or clubbing.  NEUROLOGIC: Patient is arousable. Did not do a complete neuro exam. She is able to move all 4 extremities in bed. Generalized weakness noted. PSYCHIATRIC: The patient is alert and answering simple questions. Falling right back to sleep. Not oriented.Marland Kitchen  SKIN: No obvious rash, lesion, or ulcer.    LABORATORY PANEL:   CBC  Last Labs      Recent Labs Lab 11/09/2015 0937  WBC 12.1*  HGB 12.5  HCT 39.3  PLT 189     ------------------------------------------------------------------------------------------------------------------  Chemistries   Last Labs      Recent Labs Lab 11/17/2015 0937  NA 141  K 3.9  CL 104  CO2 29  GLUCOSE 135*  BUN 23*  CREATININE 0.82  CALCIUM 9.4  AST 26  ALT 15  ALKPHOS 120  BILITOT 1.5*     ------------------------------------------------------------------------------------------------------------------  Cardiac Enzymes  Last Labs      Recent Labs Lab 06-03-2015 0937  TROPONINI <0.03      ------------------------------------------------------------------------------------------------------------------  RADIOLOGY:                         ASSESSMENT AND PLAN:   82 year old elderly female with past medical history significant for dementia, hypertension, atrial fibrillation, [facility] presents to the hospital secondary to respiratory distress.  She has the following diagnoses:  1 acute hypoxic respiratory failure #2 acute on chronic diastolic congestive heart failure exacerbation #3 hypertension #4 atrial fibrillation #5 dementia #6 history of depression.  And a new finding it is oxygen, morphine drip, Ativan. Continue comfort care measures. unstable go to hospice home because of the fever and tachycardia.  All the records are reviewed and case discussed with Care Management/Social Workerr. Management plans discussed with the patient, family and they are in agreement.  CODE STATUS: DNR   Primary care physician; Clydie BraunFITZGERALD, DAVID, MD

## 2015-12-01 DEATH — deceased

## 2016-10-20 IMAGING — US ABDOMEN ULTRASOUND LIMITED
1 series · 14 of 25 positions shown · non-contrast
Comparison: One-view abdominal radiograph from the same day.

CLINICAL DATA: Abdominal pain extending to the right shoulder.

EXAM:
US ABDOMEN LIMITED - RIGHT UPPER QUADRANT

[Series 1: abdomen ultrasound limited · 0.30mm/px · 14 of 44 slices shown]
[im 1/44]
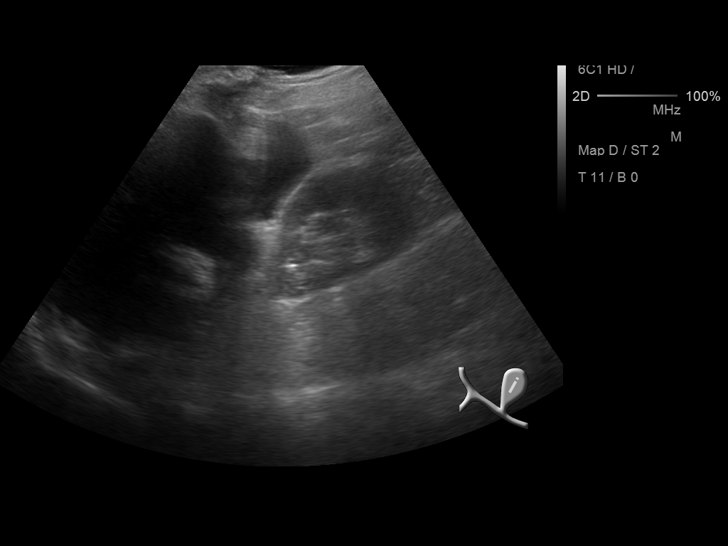
[im 4/44]
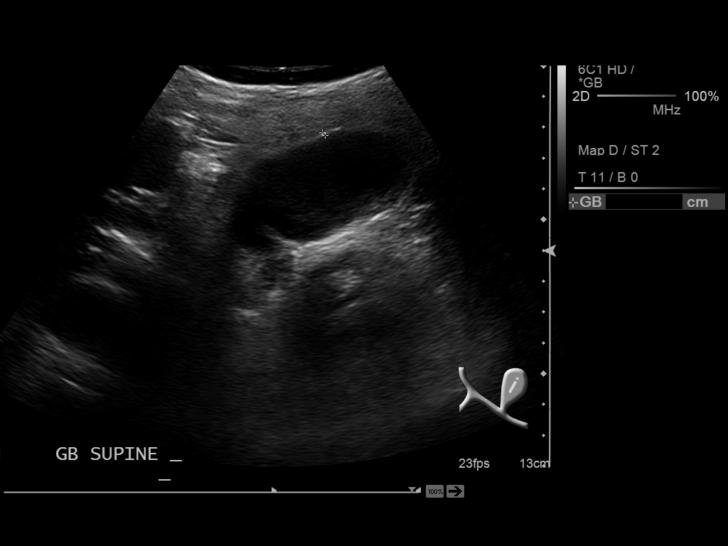
[im 8/44]
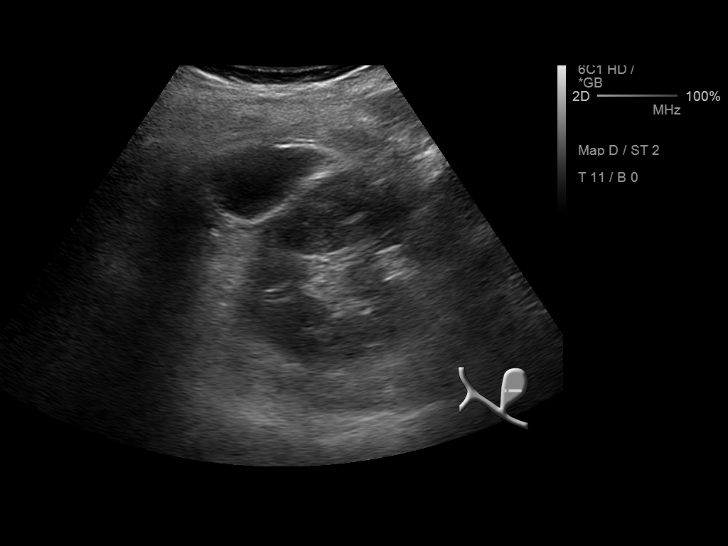
[im 11/44]
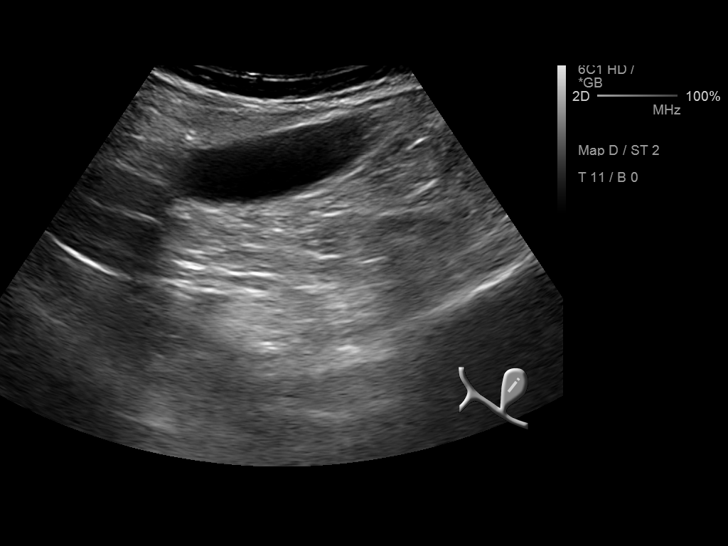
[im 15/44]
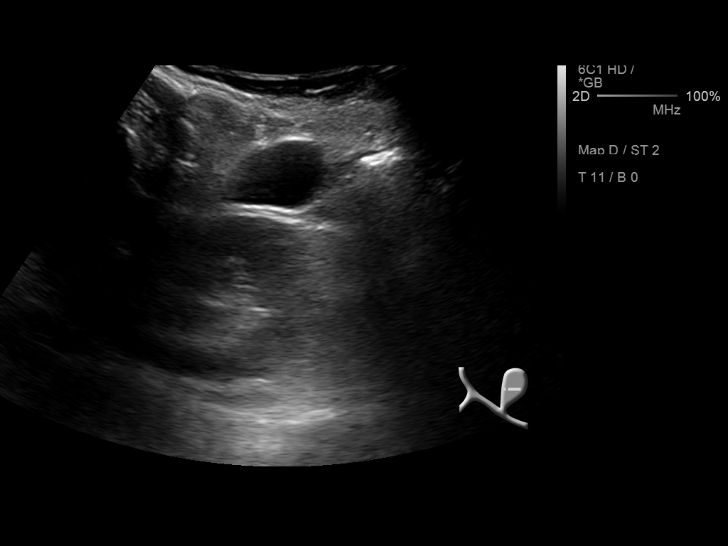
[im 17/44]
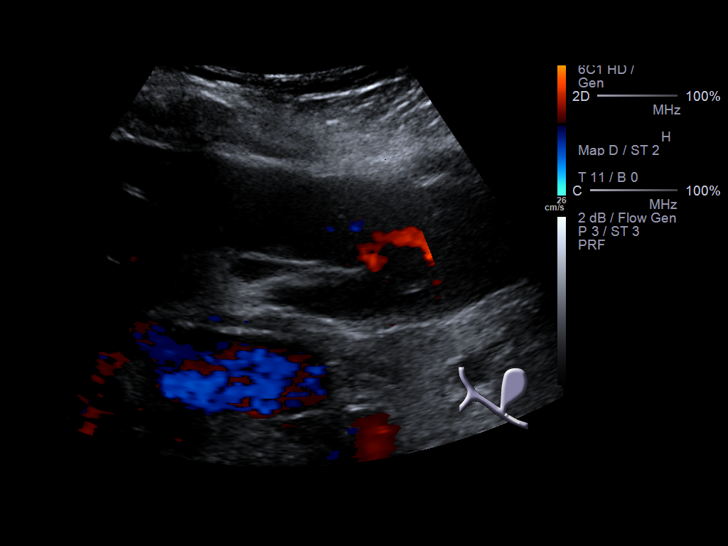
[im 20/44]
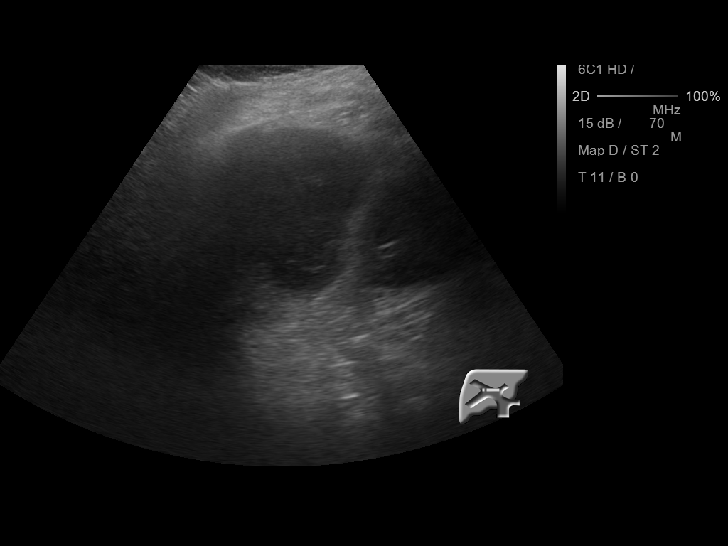
[im 24/44]
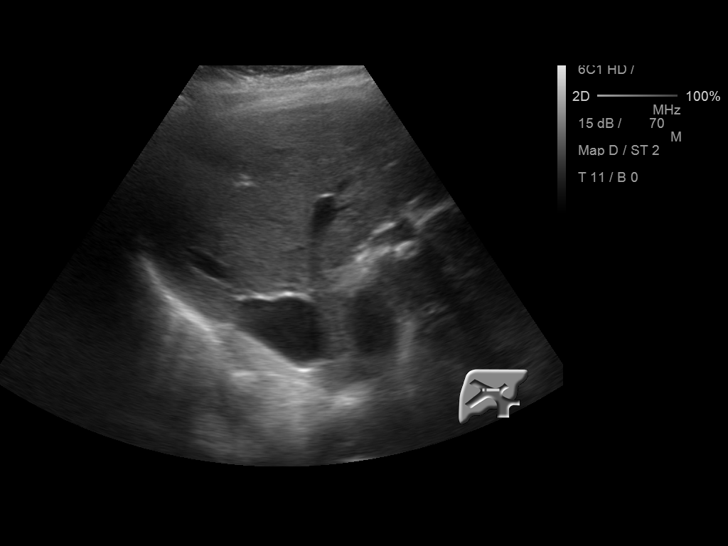
[im 27/44]
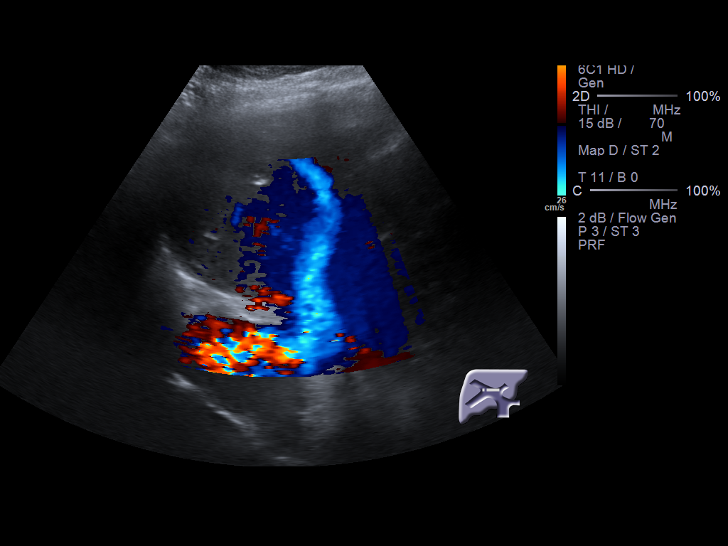
[im 29/44]
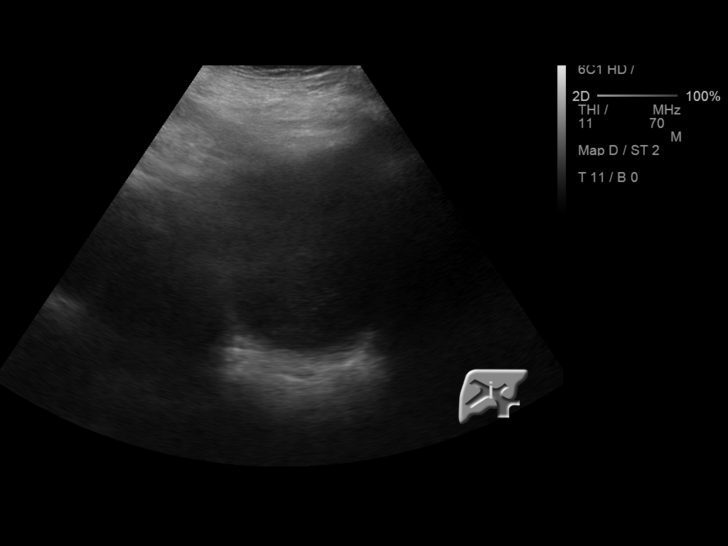
[im 33/44]
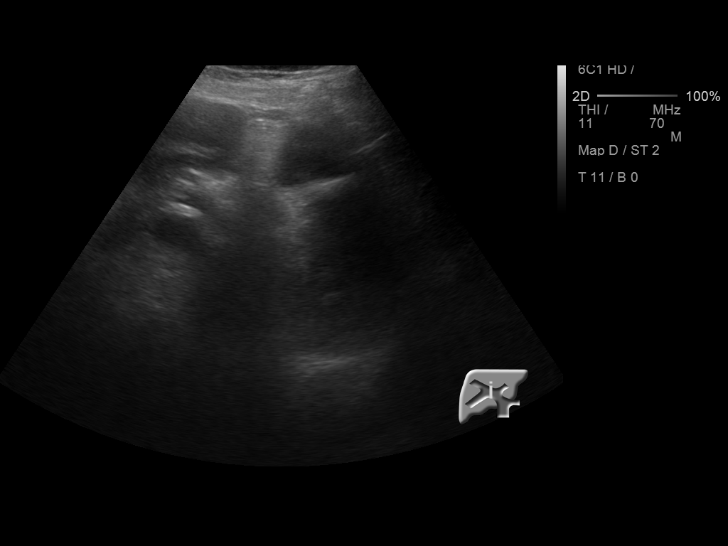
[im 36/44]
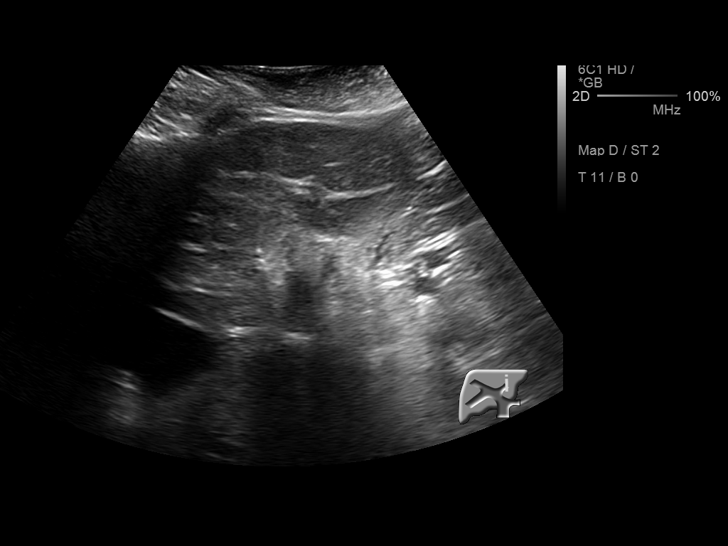
[im 40/44]
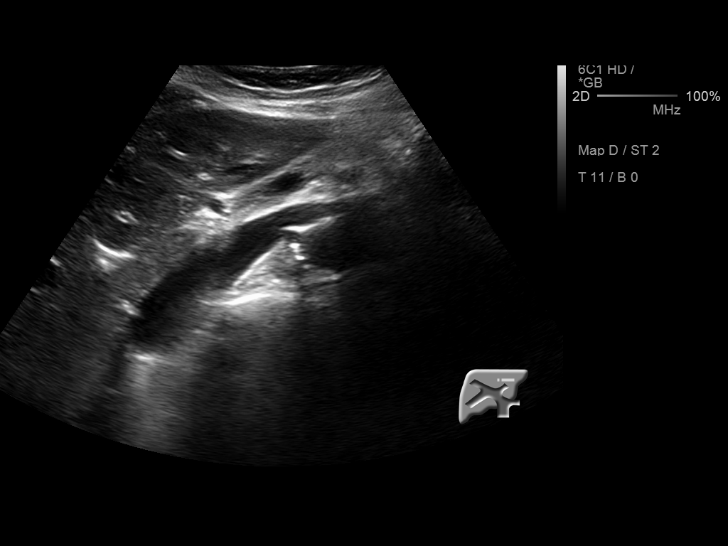
[im 44/44]
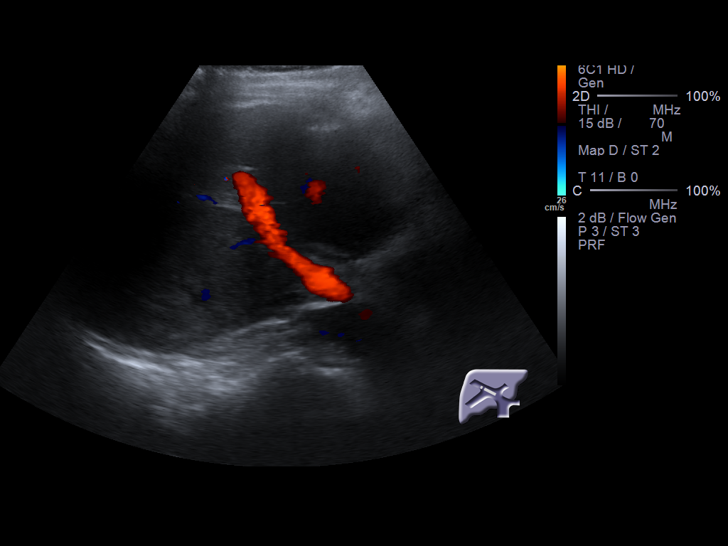

[14 of 25 positions shown; findings below may reference images not displayed]

FINDINGS: Gallbladder:

Minimal sludge versus more likely artifact is noted in the
gallbladder. There are no stones. The wall thickness is within
normal limits at 1.3 mm. No sonographic Murphy sign is present.

Common bile duct:

Diameter: 5.1 mm, within normal limits

Liver:

No focal lesion identified. Within normal limits in parenchymal
echogenicity.
IMPRESSION: Negative right upper quadrant ultrasound.

## 2017-05-21 IMAGING — XA DG FEMUR 2+V*R*
2 series · 2 of 2 positions shown · non-contrast
Comparison: 09/15/2015

CLINICAL DATA: Internal fixation of RIGHT femur fracture

EXAM:
DG C-ARM 61-120 MIN; RIGHT FEMUR 2 VIEWS

[Series 6001: 1 · 1 of 1 slices shown]
[im 1/1]
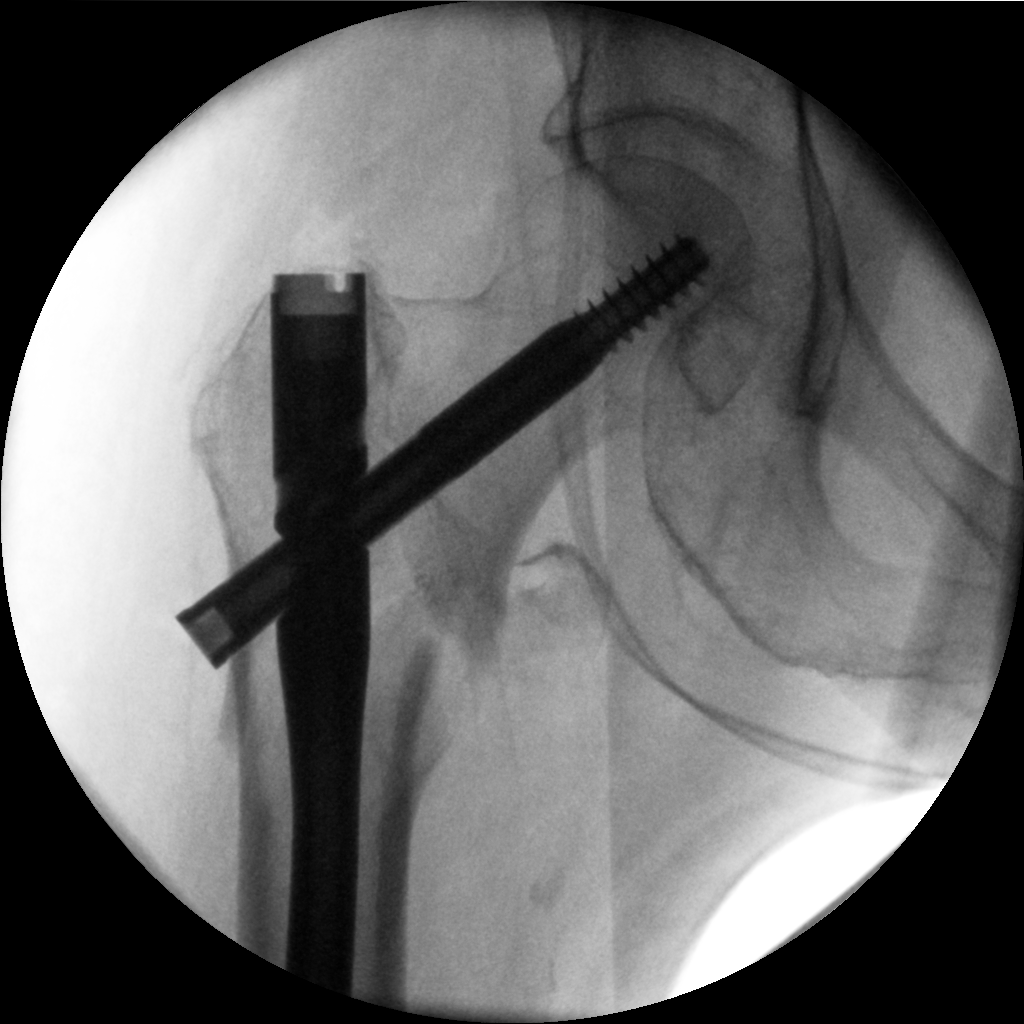

[Series 6004: 4 · 1 of 1 slices shown]
[im 1/1]
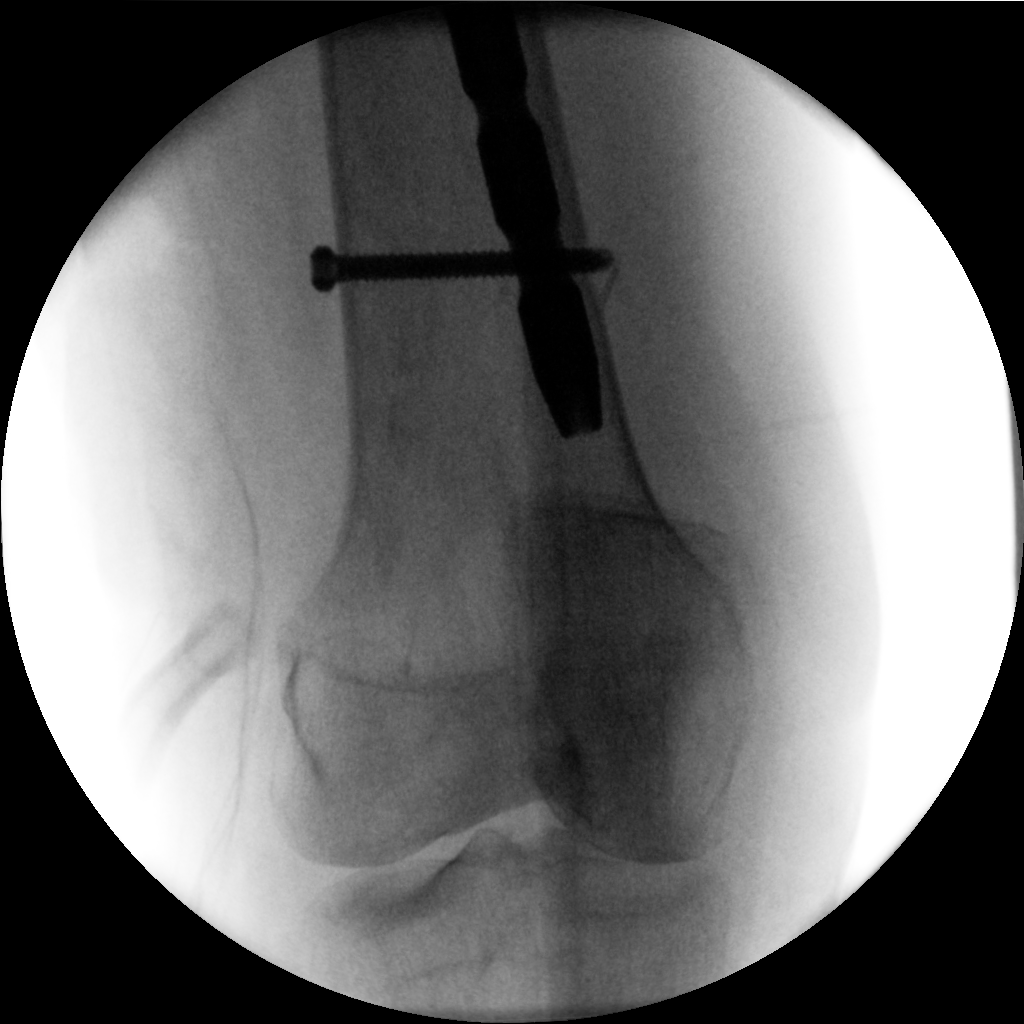

[2 of 2 positions shown; findings below may reference images not displayed]

FINDINGS: Intra medullary nail fixation of the intertrochanteric fracture on
the RIGHT. Single distal locking screw noted. Adjacent to the tip of
the distal interlocking cortical screw, there is a short
approximately 5 mm fragment of cortical elevation.
IMPRESSION: Intra medullary nail fixation of intertrochanteric RIGHT femur
fracture
